# Patient Record
Sex: Female | Born: 1960 | Race: White | Hispanic: No | Marital: Married | Smoking: Never smoker
Health system: Southern US, Community
[De-identification: ages and names within clinical notes are randomized; demographics above are authoritative.]

## PROBLEM LIST (undated history)

## (undated) DIAGNOSIS — E119 Type 2 diabetes mellitus without complications: Secondary | ICD-10-CM

## (undated) DIAGNOSIS — I10 Essential (primary) hypertension: Secondary | ICD-10-CM

## (undated) HISTORY — PX: BREAST SURGERY: SHX581

## (undated) HISTORY — PX: BREAST EXCISIONAL BIOPSY: SUR124

---

## 2011-10-21 ENCOUNTER — Emergency Department: Payer: Self-pay | Admitting: Emergency Medicine

## 2012-06-25 ENCOUNTER — Ambulatory Visit: Payer: Self-pay | Admitting: Obstetrics and Gynecology

## 2012-08-27 HISTORY — PX: OTHER SURGICAL HISTORY: SHX169

## 2013-07-28 ENCOUNTER — Ambulatory Visit: Payer: Self-pay | Admitting: Obstetrics and Gynecology

## 2013-07-28 LAB — CBC
HCT: 37.3 % (ref 35.0–47.0)
HGB: 12.6 g/dL (ref 12.0–16.0)
MCH: 30 pg (ref 26.0–34.0)
MCHC: 33.8 g/dL (ref 32.0–36.0)
MCV: 89 fL (ref 80–100)
Platelet: 297 10*3/uL (ref 150–440)
RBC: 4.2 10*6/uL (ref 3.80–5.20)

## 2013-07-28 LAB — BASIC METABOLIC PANEL
Anion Gap: 3 — ABNORMAL LOW (ref 7–16)
Co2: 30 mmol/L (ref 21–32)
EGFR (African American): >60
EGFR (Non-African Amer.): >60
Osmolality: 278 (ref 275–301)
Sodium: 138 mmol/L (ref 136–145)

## 2013-08-06 ENCOUNTER — Ambulatory Visit: Payer: Self-pay | Admitting: Obstetrics and Gynecology

## 2013-08-07 LAB — BASIC METABOLIC PANEL
Anion Gap: 4 — ABNORMAL LOW (ref 7–16)
BUN: 11 mg/dL (ref 7–18)
Calcium, Total: 8.9 mg/dL (ref 8.5–10.1)
Creatinine: 0.54 mg/dL — ABNORMAL LOW (ref 0.60–1.30)
Glucose: 130 mg/dL — ABNORMAL HIGH (ref 65–99)
Osmolality: 277 (ref 275–301)
Potassium: 3.8 mmol/L (ref 3.5–5.1)

## 2013-08-07 LAB — CBC WITH DIFFERENTIAL/PLATELET
Eosinophil %: 1 %
HCT: 33.6 % — ABNORMAL LOW (ref 35.0–47.0)
HGB: 11.6 g/dL — ABNORMAL LOW (ref 12.0–16.0)
Lymphocyte #: 1.7 10*3/uL (ref 1.0–3.6)
MCH: 30.3 pg (ref 26.0–34.0)
Monocyte %: 6.5 %
Neutrophil %: 64.8 %
Platelet: 244 10*3/uL (ref 150–440)
RBC: 3.85 10*6/uL (ref 3.80–5.20)
RDW: 12.4 % (ref 11.5–14.5)

## 2013-08-10 LAB — PATHOLOGY REPORT

## 2013-08-27 HISTORY — PX: CHOLECYSTECTOMY: SHX55

## 2013-09-09 ENCOUNTER — Ambulatory Visit: Payer: Self-pay | Admitting: Surgery

## 2013-09-17 ENCOUNTER — Ambulatory Visit: Payer: Self-pay | Admitting: Surgery

## 2013-09-20 LAB — PATHOLOGY REPORT

## 2014-07-26 ENCOUNTER — Ambulatory Visit: Payer: Self-pay | Admitting: Obstetrics and Gynecology

## 2014-08-27 ENCOUNTER — Ambulatory Visit: Payer: Self-pay | Admitting: Obstetrics and Gynecology

## 2014-12-05 IMAGING — CR DG CHOLANGIOGRAM OPERATIVE
1 series · 2 of 2 positions shown · non-contrast
Comparison: CT abdomen pelvis -06/25/2012

FLUOROSCOPY TIME:  9 seconds

CLINICAL DATA: Cholecystectomy

EXAM:
INTRAOPERATIVE CHOLANGIOGRAM

[Series 6001: (id) · 2 of 2 slices shown]
[im 1/2]
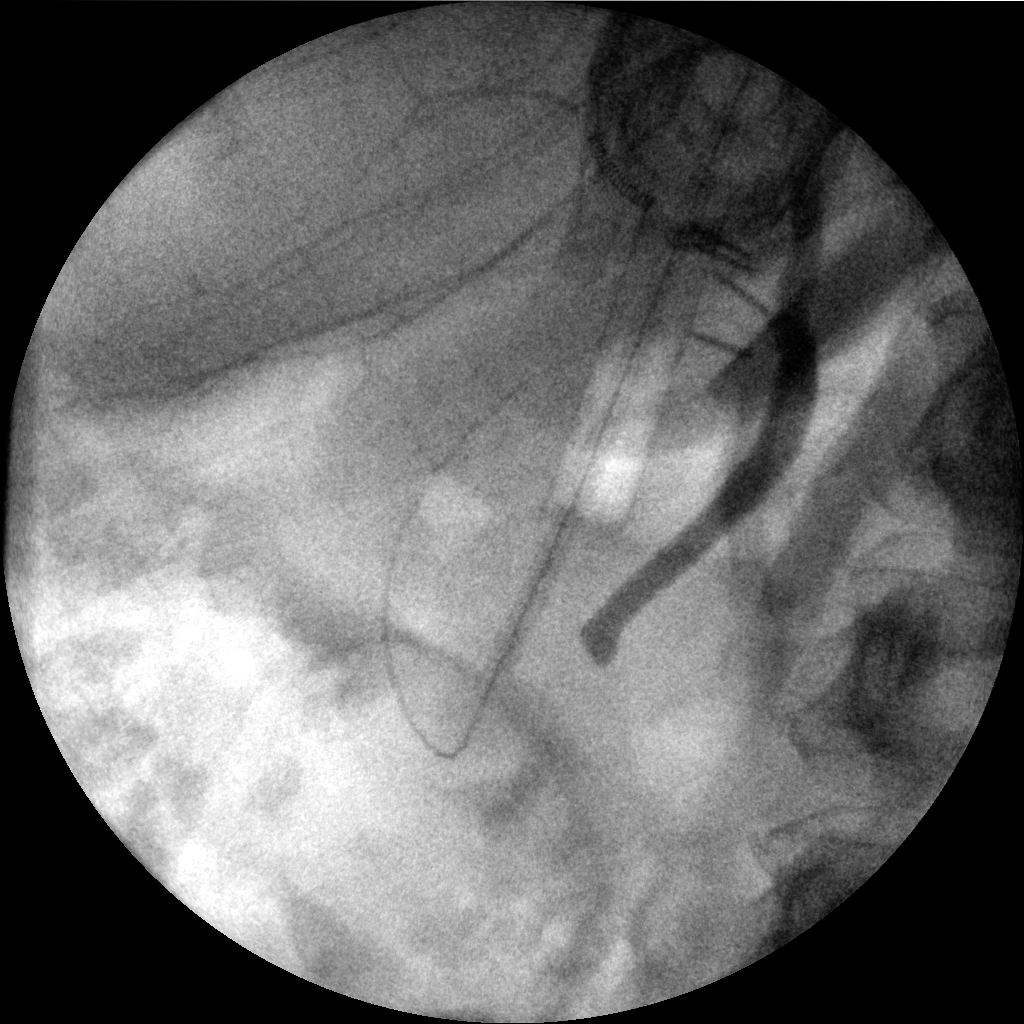
[im 2/2]
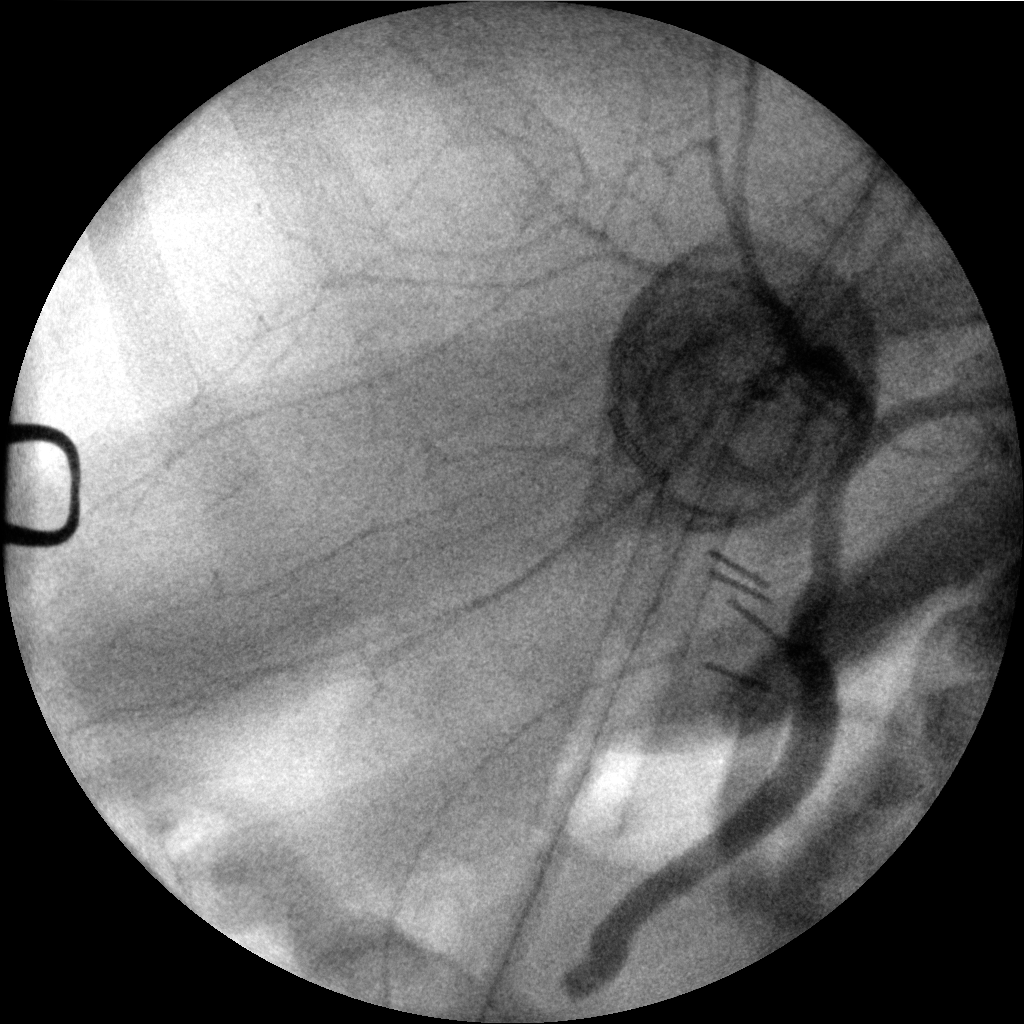

[2 of 2 positions shown; findings below may reference images not displayed]

FINDINGS: Intraoperative angiographic images of the right upper abdominal
quadrant during laparoscopic cholecystectomy are provided for
review.

Surgical clips overlie the expected location of the gallbladder
fossa.

Contrast injection demonstrates selective cannulation of the central
aspect of the cystic duct.

There is passage of contrast through the central aspect of the
cystic duct with filling of a non dilated common bile duct, however
there an apparent abrupt occlusion of the distal aspect of the
common bile duct without definitive passage into the descending
duodenum.

There is minimal reflux of injected contrast into the common hepatic
duct and central aspect of the non dilated intrahepatic biliary
system.
IMPRESSION: Apparent obstruction of the distal aspect of the common bile duct -
as such, a distal stone or stricture is not excluded on the basis of
this examination. Further evaluation with ERCP could be performed as
clinically indicated.

This was made a call report.

## 2014-12-17 NOTE — Op Note (Signed)
PATIENT NAMEKEGAN, [name] MR#:  [id number] DATE OF BIRTH:  1961-02-12  DATE OF PROCEDURE:  08/06/2013  PREOPERATIVE DIAGNOSIS: Complex adnexal masses.   POSTOPERATIVE DIAGNOSES: Endometriosis and right hydrosalpinx.   OPERATION PERFORMED: Diagnostic laparoscopy and right salpingectomy as well as peritoneal and uterine serosa biopsies.   ANESTHESIA USED: General.   PRIMARY SURGEON: Stoney Bang. Georgianne Fick, MD  ASSISTANT: Will Bonnet, MD  ESTIMATED BLOOD LOSS: Minimal.   OPERATIVE FLUIDS: 1 liter of crystalloid.   URINE OUTPUT: 10 mL of clear urine.   PREOPERATIVE ANTIBIOTICS: None.   DRAINS OR TUBES: None.   IMPLANTS: None.   COMPLICATIONS: None.   FINDINGS: Stage II to III endometriosis primarily involving the adnexa. Right hydrosalpinx and several adjacent peritoneal inclusion cysts. The left ovary was adhesed to the pelvic sidewall, and the tube was completely obliterated and not able to be visualized. The cul-de-sac, anterior as well as posterior, was free of adhesive disease.   SPECIMENS REMOVED: Right tube, uterine serosal biopsy and posterior cul-de-sac peritoneal inclusion cyst biopsies.   PATIENT CONDITION FOLLOWING THE PROCEDURE: Stable.   PROCEDURE IN DETAIL: Risks, benefits and alternatives of the procedure were discussed with the patient prior to proceeding to the operating room. The patient had these adnexal masses noted incidentally on other imaging study. She had been followed over the past year with CA-125, which remained in the normal range; however, repeat ultrasound appeared to show a somewhat more complex appearance to the masses than had previously been noted. This may have been a difference in the imaging modality used even though it was ultrasound, a 4-D scan was conducted. The impression at that time had been that the complex seen and excrescences noted were probably fallopian tube fimbria; however, given the fact that we were unable to be 867%  certain without proceeding with a laparoscopy, a decision was made to proceed with diagnostic laparoscopy for confirmation.   The patient was taken to the Operating Room, where she was placed under general endotracheal anesthesia. She was positioned in the dorsal lithotomy position utilizing Allen stirrups. The patient was prepped and draped in the usual sterile fashion, and a timeout procedure was performed. Attention was turned to the patient's pelvis. A sterile speculum was placed. The anterior lip of the cervix was grasped with a single-tooth tenaculum, and a Hulka tenaculum was then placed to allow manipulation of the uterus. The single-tooth tenaculum and operative speculum were removed. The patient's bladder was straight-cathed prior to placing the Hulka tenaculum with only 10 mL of urine returned. Attention was then turned to the patient's abdomen. The umbilicus was infiltrated with 0.5% Sensorcaine. A stab incision was made at the base of the umbilicus, and entry into the peritoneum was undertaken using 5 mm XL trocar under direct visualization. Once pneumoperitoneum had been established, a 5 mm right assistant port and a 10 mm left assistant port were placed under direct visualization. The findings were as noted above. The right tube was bluntly dissected off the pelvic sidewall, and the tube was then transected off its attachments to the mesosalpinx using a 5 mm LigaSure. The uterine serosa had several implants which appeared to be consistent with endometriosis, one of which was sampled. There were subtle peritoneal inclusion cysts, and the right posterior portion of the uterus and adnexa were removed and sent to pathology for analysis. The pedicles were inspected and noted to be hemostatic following the procedure. The pneumoperitoneum was evacuated. Sponge, needle and instrument counts were correct x2. The  trocar sites were dressed with Dermabond, and the 10 mm port site was closed with a 4-0 Monocryl  as well as Dermabond. The patient tolerated the procedure well and was taken to the recovery room in stable condition.    ____________________________ Stoney Bang. Georgianne Fick, MD ams:lb D: 08/08/2013 11:02:10 ET T: 08/08/2013 11:58:09 ET JOB#: [id number]  cc: Stoney Bang. Georgianne Fick, MD, <Dictator> Dorthula Nettles MD ELECTRONICALLY SIGNED 08/16/2013 19:04

## 2014-12-18 NOTE — Op Note (Signed)
PATIENT NAME:  [name], [name] MR#:  [id number] DATE OF BIRTH:  1960-12-11  DATE OF PROCEDURE:  09/17/2013  PREOPERATIVE DIAGNOSIS: Chronic cholecystitis, cholelithiasis.   POSTOPERATIVE DIAGNOSIS: Chronic cholecystitis, cholelithiasis with common bile duct obstruction, possible choledocholithiasis.   PROCEDURE: Laparoscopic cholecystectomy, cholangiogram.   SURGEON: Rochel Brome, M.D.   ANESTHESIA: General.   INDICATIONS: This 54 year old female has history of severe right upper quadrant pain. She had an ultrasound which demonstrated cholelithiasis and surgery was recommended for definitive treatment.   DESCRIPTION OF PROCEDURE: The patient was placed on the operating table in the supine position under general endotracheal anesthesia. The abdomen was prepared with ChloraPrep and draped in a sterile manner.   A short incision was made in the inferior aspect of the umbilicus and carried down to the deep fascia which was grasped with laryngeal hook and elevated. A Veress needle was inserted, aspirated, and irrigated with a saline solution. The peritoneal cavity was insufflated with carbon dioxide. The Veress needle was removed. The 10 mm cannula was inserted. The 10 mm, 0 degree laparoscope was inserted to view the peritoneal cavity. The liver appeared smooth. There were a few adhesions between the omentum and the falciform ligament. Brief survey revealed unremarkable intestines. Next, with the patient in the reverse Trendelenburg position another incision was made in the epigastrium slightly to the right of the midline to introduce an 11 mm cannula. Two incisions were made in the lateral aspect of the right upper quadrant to introduce two 5 mm cannulas.   A number of adhesions were taken down between the omentum and the falciform ligament with sharp dissection. Subsequently, the gallbladder was examined and it appeared to have some slight thickening of the gallbladder wall and was retracted towards  the right shoulder. Dissection was carried out along the infundibulum, mobilizing the gallbladder with incision of the visceral peritoneum.  The porta hepatis was demonstrated. The cystic duct appeared to be large in size and was dissected free from surrounding structures. The cystic artery was dissected free from surrounding structures. The lower third of the gallbladder was separated from the liver. A critical view of safety was demonstrated. Next, Endoclips were placed across the cystic artery proximally and distally and divided. This allowed better traction on the cystic duct. Next, an Endo Clip was placed across the cystic duct adjacent to the neck of the gallbladder. An incision was made in the cystic duct to introduce a Reddick catheter. Half-strength Conray-60 dye was injected as the cholangiogram was done with fluoroscopy viewing the biliary tree and a normal caliber of the common bile duct, however, no dye passed into the duodenum. There appeared to be abrupt end of the common bile duct and I allowed several minutes and then made additional spot films. I did not see any dye in the duodenum. It appeared that it may possibly be a stone in the distal common duct.   Next, the Reddick catheter was removed. The cystic duct was doubly ligated with Endoclips and divided. Next, the cystic duct was further ligated with a 0 chromic Endoloop and one additional clip was placed across the cystic duct adjacent to the Endoloop. Next, the gallbladder was dissected free from the liver with use of hook and cautery. The site was irrigated with heparinized saline solution. Bleeding was minimal. Hemostasis was subsequently intact. There was no bile drainage during the course of the dissection. The gallbladder was brought up through the infraumbilical incision, opened and suctioned, was found to have multiple large stones, and  in the course of removing small stones from the gallbladder with the stone forceps and stone scoop it  was necessary to lengthen the incision by about 1.5 cm in length and lengthen the fascial incision and subsequently removed the gallbladder with a large stone within. The gallbladder was sent with stones for routine pathology.   Next, 0 Vicryl suture was placed in the fascia to began closure of the fascia at the umbilicus and then the 10 mm cannula was reinserted. There were several small stones laying on the omentum which were retrieved with the stone scoop and also suctioned and irrigated this area. The right upper quadrant was further inspected, irrigated and suctioned and hemostasis was intact.   Next, the cannulas were removed. There were several subcutaneous bleeding points which were cauterized. All wounds were infiltrated with 0.5% Sensorcaine with epinephrine. Hemostasis was subsequently intact. It is noted that the remainder of the fascial defect at the umbilicus was closed with interrupted 0 Vicryl figure-of-eight sutures. Skin incisions were closed with interrupted 5-0 chromic subcuticular sutures, benzoin, and Steri-Strips. Dressings were applied with paper tape. The patient tolerated the surgery satisfactorily and was moved to the recovery room for postoperative care.  ____________________________ J. Rochel Brome, MD jws:sg D: 09/17/2013 10:10:06 ET T: 09/17/2013 10:37:00 ET JOB#: [id number]  cc: Loreli Dollar, MD, <Dictator> Loreli Dollar MD ELECTRONICALLY SIGNED 09/22/2013 9:00

## 2016-10-31 ENCOUNTER — Other Ambulatory Visit: Payer: Self-pay | Admitting: Obstetrics and Gynecology

## 2016-10-31 NOTE — Telephone Encounter (Signed)
Pt aware.

## 2016-10-31 NOTE — Telephone Encounter (Signed)
[facility] annual was in 06/2016. Last time refilled was 10/2015. Please advise if patient needs medication follow up, Or please refill. Thanks   KJ CMA

## 2016-11-26 ENCOUNTER — Telehealth: Payer: Self-pay

## 2016-11-26 NOTE — Telephone Encounter (Signed)
Patient calling to request order for diagnostic mammogram. Pt had annual with [facility] 06/2016. She would like to have scheduled this week as she is off for spring break. cb# [phone] thank you

## 2016-11-28 ENCOUNTER — Other Ambulatory Visit: Payer: Self-pay | Admitting: Obstetrics and Gynecology

## 2016-11-28 DIAGNOSIS — Z1239 Encounter for other screening for malignant neoplasm of breast: Secondary | ICD-10-CM

## 2016-11-28 NOTE — Telephone Encounter (Signed)
I have placed an order for a screening mammogram. In order to the order a diagnostic mammogram there has to be a palpable area of concern and I'd have to see her first

## 2016-11-28 NOTE — Telephone Encounter (Signed)
Pt aware and appreciative  

## 2017-03-07 ENCOUNTER — Encounter: Payer: Self-pay | Admitting: Obstetrics and Gynecology

## 2017-11-16 ENCOUNTER — Other Ambulatory Visit: Payer: Self-pay | Admitting: Obstetrics and Gynecology

## 2018-01-10 ENCOUNTER — Ambulatory Visit (INDEPENDENT_AMBULATORY_CARE_PROVIDER_SITE_OTHER): Payer: BC Managed Care – PPO | Admitting: Obstetrics and Gynecology

## 2018-01-10 ENCOUNTER — Encounter: Payer: Self-pay | Admitting: Obstetrics and Gynecology

## 2018-01-10 DIAGNOSIS — Z1231 Encounter for screening mammogram for malignant neoplasm of breast: Secondary | ICD-10-CM | POA: Diagnosis not present

## 2018-01-10 DIAGNOSIS — Z124 Encounter for screening for malignant neoplasm of cervix: Secondary | ICD-10-CM | POA: Diagnosis not present

## 2018-01-10 DIAGNOSIS — Z0000 Encounter for general adult medical examination without abnormal findings: Secondary | ICD-10-CM | POA: Diagnosis not present

## 2018-01-10 DIAGNOSIS — Z01419 Encounter for gynecological examination (general) (routine) without abnormal findings: Secondary | ICD-10-CM | POA: Diagnosis not present

## 2018-01-10 DIAGNOSIS — Z1239 Encounter for other screening for malignant neoplasm of breast: Secondary | ICD-10-CM

## 2018-01-10 NOTE — Patient Instructions (Signed)
Preventive Care 40-64 Years, Female Preventive care refers to lifestyle choices and visits with your health care provider that can promote health and wellness. What does preventive care include?  A yearly physical exam. This is also called an annual well check.  Dental exams once or twice a year.  Routine eye exams. Ask your health care provider how often you should have your eyes checked.  Personal lifestyle choices, including: ? Daily care of your teeth and gums. ? Regular physical activity. ? Eating a healthy diet. ? Avoiding tobacco and drug use. ? Limiting alcohol use. ? Practicing safe sex. ? Taking low-dose aspirin daily starting at age 58. ? Taking vitamin and mineral supplements as recommended by your health care provider. What happens during an annual well check? The services and screenings done by your health care provider during your annual well check will depend on your age, overall health, lifestyle risk factors, and family history of disease. Counseling Your health care provider may ask you questions about your:  Alcohol use.  Tobacco use.  Drug use.  Emotional well-being.  Home and relationship well-being.  Sexual activity.  Eating habits.  Work and work Statistician.  Method of birth control.  Menstrual cycle.  Pregnancy history.  Screening You may have the following tests or measurements:  Height, weight, and BMI.  Blood pressure.  Lipid and cholesterol levels. These may be checked every 5 years, or more frequently if you are over 81 years old.  Skin check.  Lung cancer screening. You may have this screening every year starting at age 78 if you have a 30-pack-year history of smoking and currently smoke or have quit within the past 15 years.  Fecal occult blood test (FOBT) of the stool. You may have this test every year starting at age 65.  Flexible sigmoidoscopy or colonoscopy. You may have a sigmoidoscopy every 5 years or a colonoscopy  every 10 years starting at age 30.  Hepatitis C blood test.  Hepatitis B blood test.  Sexually transmitted disease (STD) testing.  Diabetes screening. This is done by checking your blood sugar (glucose) after you have not eaten for a while (fasting). You may have this done every 1-3 years.  Mammogram. This may be done every 1-2 years. Talk to your health care provider about when you should start having regular mammograms. This may depend on whether you have a family history of breast cancer.  BRCA-related cancer screening. This may be done if you have a family history of breast, ovarian, tubal, or peritoneal cancers.  Pelvic exam and Pap test. This may be done every 3 years starting at age 80. Starting at age 36, this may be done every 5 years if you have a Pap test in combination with an HPV test.  Bone density scan. This is done to screen for osteoporosis. You may have this scan if you are at high risk for osteoporosis.  Discuss your test results, treatment options, and if necessary, the need for more tests with your health care provider. Vaccines Your health care provider may recommend certain vaccines, such as:  Influenza vaccine. This is recommended every year.  Tetanus, diphtheria, and acellular pertussis (Tdap, Td) vaccine. You may need a Td booster every 10 years.  Varicella vaccine. You may need this if you have not been vaccinated.  Zoster vaccine. You may need this after age 5.  Measles, mumps, and rubella (MMR) vaccine. You may need at least one dose of MMR if you were born in  1957 or later. You may also need a second dose.  Pneumococcal 13-valent conjugate (PCV13) vaccine. You may need this if you have certain conditions and were not previously vaccinated.  Pneumococcal polysaccharide (PPSV23) vaccine. You may need one or two doses if you smoke cigarettes or if you have certain conditions.  Meningococcal vaccine. You may need this if you have certain  conditions.  Hepatitis A vaccine. You may need this if you have certain conditions or if you travel or work in places where you may be exposed to hepatitis A.  Hepatitis B vaccine. You may need this if you have certain conditions or if you travel or work in places where you may be exposed to hepatitis B.  Haemophilus influenzae type b (Hib) vaccine. You may need this if you have certain conditions.  Talk to your health care provider about which screenings and vaccines you need and how often you need them. This information is not intended to replace advice given to you by your health care provider. Make sure you discuss any questions you have with your health care provider. Document Released: 09/09/2015 Document Revised: 05/02/2016 Document Reviewed: 06/14/2015 Elsevier Interactive Patient Education  2018 [employer] Inc[employer]  

## 2018-01-10 NOTE — Progress Notes (Signed)
Gynecology Annual Exam  PCP: Patient, No Pcp Per  Chief Complaint:  Chief Complaint  Patient presents with  . Gynecologic Exam    History of Present Illness: Patient is a 57 y.o. G3P3 presents for annual exam. The patient has no complaints today.   LMP: No LMP recorded. Postmenopausal no bleeding concerns  The patient is sexually active.She denies dyspareunia.  The patient does perform self breast exams.  There is no notable family history of breast or ovarian cancer in her family.  The patient wears seatbelts: yes.   The patient has regular exercise: not asked.    The patient denies current symptoms of depression.    Review of Systems: Review of Systems  Constitutional: Negative for chills and fever.  HENT: Negative for congestion.   Respiratory: Negative for cough and shortness of breath.   Cardiovascular: Negative for chest pain and palpitations.  Gastrointestinal: Negative for abdominal pain, constipation, diarrhea, heartburn, nausea and vomiting.  Genitourinary: Negative for dysuria, frequency and urgency.  Skin: Negative for itching and rash.  Neurological: Negative for dizziness and headaches.  Endo/Heme/Allergies: Negative for polydipsia.  Psychiatric/Behavioral: Negative for depression.    Past Medical History:  No past medical history on file.  Past Surgical History:  Past Surgical History:  Procedure Laterality Date  . BREAST BIOPSY     x3  . BREAST SURGERY    . CHOLECYSTECTOMY  2015  . Right salpingectomy  2014    Gynecologic History:  No LMP recorded. Last Pap: Results were: 07/11/2016 NIL and HR HPV negative  Last mammogram: 2018 Results were: BI-RAD I  Obstetric History: G3P3  Family History:  Family History  Problem Relation Age of Onset  . Kidney cancer Maternal Grandfather     Social History:  Social History   Socioeconomic History  . Marital status: Married    Spouse name: Not on file  . Number of children: Not on file  .  Years of education: Not on file  . Highest education level: Not on file  Occupational History  . Not on file  Social Needs  . Financial resource strain: Not on file  . Food insecurity:    Worry: Not on file    Inability: Not on file  . Transportation needs:    Medical: Not on file    Non-medical: Not on file  Tobacco Use  . Smoking status: Never Smoker  . Smokeless tobacco: Never Used  Substance and Sexual Activity  . Alcohol use: Not Currently  . Drug use: Never  . Sexual activity: Yes    Partners: Male    Birth control/protection: Surgical    Comment: Vasectomy  Lifestyle  . Physical activity:    Days per week: 0 days    Minutes per session: 0 min  . Stress: Not on file  Relationships  . Social connections:    Talks on phone: Not on file    Gets together: Not on file    Attends religious service: Not on file    Active member of club or organization: Not on file    Attends meetings of clubs or organizations: Not on file    Relationship status: Not on file  . Intimate partner violence:    Fear of current or ex partner: Not on file    Emotionally abused: Not on file    Physically abused: Not on file    Forced sexual activity: Not on file  Other Topics Concern  . Not on file  Social History Narrative  . Not on file    Allergies:  Allergies  Allergen Reactions  . Codeine Nausea Only  . Penicillins Hives  . Cyclobenzaprine Itching and Rash  . Erythromycin Rash    Medications: Prior to Admission medications   Medication Sig Start Date End Date Taking? Authorizing Provider  aspirin EC 81 MG tablet Take by mouth.   Yes [provider]  atorvastatin (LIPITOR) 10 MG tablet Take by mouth. 08/22/17 08/22/18 Yes [provider]  cetirizine-pseudoephedrine (ZYRTEC-D) 5-120 MG tablet Take by mouth.   Yes [provider]  Cholecalciferol (VITAMIN D-1000 MAX ST) 1000 units tablet Take by mouth.   Yes [provider]  losartan (COZAAR)  50 MG tablet Take by mouth. 10/07/17 10/07/18 Yes [provider]  metFORMIN (GLUCOPHAGE-XR) 500 MG 24 hr tablet TAKE 1 TABLET BY MOUTH DAILY WITH DINNER. 09/02/17  Yes [provider]  Multiple Vitamin (MULTIVITAMIN) capsule Take by mouth.   Yes [provider]  Omega-3 Fatty Acids (FISH OIL) 1000 MG CAPS Take by mouth.   Yes [provider]  Turmeric Curcumin 500 MG CAPS Take by mouth.   Yes [provider]  venlafaxine XR (EFFEXOR-XR) 37.5 MG 24 hr capsule TAKE 1 CAPSULE (37.5 MG) BY ORAL ROUTE ONCE DAILY WITH FOOD 11/18/17  Yes Malachy Mood, MD  vitamin B-12 (CYANOCOBALAMIN) 1000 MCG tablet Take by mouth.   Yes [provider]    Physical Exam Vitals: Blood pressure (!) 144/94, pulse (!) 105, height 5\' 1"  (1.549 m), weight 153 lb (69.4 kg).  General: NAD HEENT: normocephalic, anicteric Thyroid: no enlargement, no palpable nodules Pulmonary: No increased work of breathing, CTAB Cardiovascular: RRR, distal pulses 2+ Breast: Breast symmetrical, no tenderness, no palpable nodules or masses, no skin or nipple retraction present, no nipple discharge.  No axillary or supraclavicular lymphadenopathy. Abdomen: NABS, soft, non-tender, non-distended.  Umbilicus without lesions.  No hepatomegaly, splenomegaly or masses palpable. No evidence of hernia  Genitourinary:  External: Normal external female genitalia.  Normal urethral meatus, normal Bartholin's and Skene's glands.    Vagina: Normal vaginal mucosa, no evidence of prolapse.    Cervix: Grossly normal in appearance, no bleeding  Uterus: Non-enlarged, mobile, normal contour.  No CMT  Adnexa: ovaries non-enlarged, no adnexal masses  Rectal: deferred  Lymphatic: no evidence of inguinal lymphadenopathy Extremities: no edema, erythema, or tenderness Neurologic: Grossly intact Psychiatric: mood appropriate, affect full  Female chaperone present for pelvic and breast  portions of the physical  exam    Assessment: 57 y.o. G3P3 routine annual exam  Plan: Problem List Items Addressed This Visit    None    Visit Diagnoses    Screening for malignant neoplasm of cervix       Relevant Orders   PapIG, HPV, rfx 16/18   Breast screening       Relevant Orders   MM DIGITAL SCREENING BILATERAL   Encounter for gynecological examination without abnormal finding       Relevant Orders   PapIG, HPV, rfx 16/18      1) Mammogram - recommend yearly screening mammogram.  Mammogram Was ordered today   2) STI screening  was notoffered and therefore not obtained  3) ASCCP guidelines and rational discussed.  Patient opts for every 3 years screening interval  4) Contraception - N/A  5) Colonoscopy -- up to date 2016 [facility] next 2021  6) Routine healthcare maintenance including cholesterol, diabetes screening discussed managed by PCP  7) Return in  1 year (on 01/11/2019) for annual.   Malachy Mood, MD, [place] 01/10/2018, 3:58 PM

## 2018-01-11 ENCOUNTER — Encounter: Payer: Self-pay | Admitting: Obstetrics and Gynecology

## 2018-01-15 LAB — PAPIG, HPV, RFX 16/18
HPV, high-risk: NEGATIVE
PAP SMEAR COMMENT: 0

## 2018-03-05 ENCOUNTER — Other Ambulatory Visit: Payer: Self-pay

## 2018-03-05 ENCOUNTER — Emergency Department
Admission: EM | Admit: 2018-03-05 | Discharge: 2018-03-05 | Disposition: A | Discharge status: Home / Self Care | Payer: BC Managed Care – PPO | Attending: Emergency Medicine | Admitting: Emergency Medicine

## 2018-03-05 ENCOUNTER — Emergency Department: Payer: BC Managed Care – PPO

## 2018-03-05 DIAGNOSIS — Z7984 Long term (current) use of oral hypoglycemic drugs: Secondary | ICD-10-CM | POA: Insufficient documentation

## 2018-03-05 DIAGNOSIS — I10 Essential (primary) hypertension: Secondary | ICD-10-CM | POA: Insufficient documentation

## 2018-03-05 DIAGNOSIS — Z79899 Other long term (current) drug therapy: Secondary | ICD-10-CM | POA: Insufficient documentation

## 2018-03-05 DIAGNOSIS — R079 Chest pain, unspecified: Secondary | ICD-10-CM | POA: Insufficient documentation

## 2018-03-05 DIAGNOSIS — Z7982 Long term (current) use of aspirin: Secondary | ICD-10-CM | POA: Diagnosis not present

## 2018-03-05 DIAGNOSIS — E119 Type 2 diabetes mellitus without complications: Secondary | ICD-10-CM | POA: Diagnosis not present

## 2018-03-05 DIAGNOSIS — R55 Syncope and collapse: Secondary | ICD-10-CM | POA: Diagnosis not present

## 2018-03-05 HISTORY — DX: Type 2 diabetes mellitus without complications: E11.9

## 2018-03-05 HISTORY — DX: Essential (primary) hypertension: I10

## 2018-03-05 LAB — BASIC METABOLIC PANEL
ANION GAP: 11 (ref 5–15)
BUN: 20 mg/dL (ref 6–20)
CALCIUM: 10 mg/dL (ref 8.9–10.3)
CO2: 27 mmol/L (ref 22–32)
Chloride: 101 mmol/L (ref 98–111)
Creatinine, Ser: 0.54 mg/dL (ref 0.44–1.00)
GFR calc Af Amer: >60 mL/min (ref >60)
GLUCOSE: 127 mg/dL — AB (ref 70–99)
POTASSIUM: 3.3 mmol/L — AB (ref 3.5–5.1)
SODIUM: 139 mmol/L (ref 135–145)

## 2018-03-05 LAB — URINALYSIS, COMPLETE (UACMP) WITH MICROSCOPIC
BILIRUBIN URINE: NEGATIVE
Glucose, UA: NEGATIVE mg/dL
Hgb urine dipstick: NEGATIVE
Ketones, ur: NEGATIVE mg/dL
LEUKOCYTES UA: NEGATIVE
Nitrite: NEGATIVE
PH: 5 (ref 5.0–8.0)
Protein, ur: NEGATIVE mg/dL
SPECIFIC GRAVITY, URINE: 1.01 (ref 1.005–1.030)

## 2018-03-05 LAB — CBC WITH DIFFERENTIAL/PLATELET
BASOS ABS: 0 10*3/uL (ref 0–0.1)
Basophils Relative: 0 %
EOS ABS: 0 10*3/uL (ref 0–0.7)
EOS PCT: 0 %
HCT: 42.4 % (ref 35.0–47.0)
Hemoglobin: 15.1 g/dL (ref 12.0–16.0)
LYMPHS ABS: 1.5 10*3/uL (ref 1.0–3.6)
LYMPHS PCT: 19 %
MCH: 30.7 pg (ref 26.0–34.0)
MCHC: 35.6 g/dL (ref 32.0–36.0)
MCV: 86.2 fL (ref 80.0–100.0)
MONO ABS: 0.5 10*3/uL (ref 0.2–0.9)
MONOS PCT: 7 %
Neutro Abs: 5.8 10*3/uL (ref 1.4–6.5)
Neutrophils Relative %: 74 %
PLATELETS: 313 10*3/uL (ref 150–440)
RBC: 4.92 MIL/uL (ref 3.80–5.20)
RDW: 12.7 % (ref 11.5–14.5)
WBC: 7.9 10*3/uL (ref 3.6–11.0)

## 2018-03-05 LAB — COMPREHENSIVE METABOLIC PANEL
ALK PHOS: 76 U/L (ref 38–126)
ALT: 50 U/L — AB (ref 0–44)
AST: 42 U/L — AB (ref 15–41)
Albumin: 5 g/dL (ref 3.5–5.0)
Anion gap: 12 (ref 5–15)
BUN: 23 mg/dL — AB (ref 6–20)
CALCIUM: 10.7 mg/dL — AB (ref 8.9–10.3)
CHLORIDE: 98 mmol/L (ref 98–111)
CO2: 27 mmol/L (ref 22–32)
CREATININE: 0.48 mg/dL (ref 0.44–1.00)
GFR calc Af Amer: >60 mL/min (ref >60)
Glucose, Bld: 178 mg/dL — ABNORMAL HIGH (ref 70–99)
Potassium: 3.4 mmol/L — ABNORMAL LOW (ref 3.5–5.1)
SODIUM: 137 mmol/L (ref 135–145)
Total Bilirubin: 1.3 mg/dL — ABNORMAL HIGH (ref 0.3–1.2)
Total Protein: 8.6 g/dL — ABNORMAL HIGH (ref 6.5–8.1)

## 2018-03-05 LAB — TROPONIN I

## 2018-03-05 LAB — FIBRIN DERIVATIVES D-DIMER (ARMC ONLY): FIBRIN DERIVATIVES D-DIMER (ARMC): 147.97 ng{FEU}/mL (ref 0.00–499.00)

## 2018-03-05 MED ORDER — SODIUM CHLORIDE 0.9 % IV BOLUS
1000.0000 mL | Freq: Once | INTRAVENOUS | Status: AC
  Administered 2018-03-05: 1000 mL via INTRAVENOUS

## 2018-03-05 MED ORDER — SODIUM CHLORIDE 0.9 % IV BOLUS
1000.0000 mL | Freq: Once | INTRAVENOUS | Status: AC
Start: 2018-03-05 — End: 2018-03-05
  Administered 2018-03-05: 1000 mL via INTRAVENOUS

## 2018-03-05 MED ORDER — POTASSIUM CHLORIDE CRYS ER 20 MEQ PO TBCR
40.0000 meq | EXTENDED_RELEASE_TABLET | Freq: Once | ORAL | Status: AC
  Administered 2018-03-05: 40 meq via ORAL
  Filled 2018-03-05: qty 2

## 2018-03-05 MED ORDER — IOHEXOL 350 MG/ML SOLN
75.0000 mL | Freq: Once | INTRAVENOUS | Status: AC | PRN
  Administered 2018-03-05: 75 mL via INTRAVENOUS

## 2018-03-05 NOTE — ED Provider Notes (Signed)
[facility] Emergency Department Provider Note   ____________________________________________   First MD Initiated Contact with Patient 03/05/18 1105     (approximate)  I have reviewed the triage vital signs and the nursing notes.   HISTORY  Chief Complaint Weakness    HPI [name] is a 57 y.o. female patient was at work and is supposed him today and became pale diaphoretic felt weak and nearly passed out.  She has had problems from last week with high blood pressure generalized weakness she had a headache several days ago that was mild to moderate in nature but it slowly resolved she is been looking pale she had her blood pressure medications adjusted potassium was elevated and is come down a little bit blood pressure is coming down slowly he is also had some nausea today along with a near syncope.  She reports the conference call was warm.   Past Medical History:  Diagnosis Date  . Diabetes mellitus without complication ([place])   . Hypertension     There are no active problems to display for this patient.   Past Surgical History:  Procedure Laterality Date  . BREAST BIOPSY     x3  . BREAST SURGERY    . CHOLECYSTECTOMY  2015  . Right salpingectomy  2014    Prior to Admission medications   Medication Sig Start Date End Date Taking? Authorizing Provider  aspirin EC 81 MG tablet Take by mouth.   Yes [provider]  atorvastatin (LIPITOR) 10 MG tablet Take 10 mg by mouth daily.  08/22/17 08/22/18 Yes [provider]  Cholecalciferol (VITAMIN D-1000 MAX ST) 1000 units tablet Take by mouth.   Yes [provider]  CINNAMON PO Take 1 tablet by mouth daily.   Yes [provider]  losartan-hydrochlorothiazide (HYZAAR) 50-12.5 MG tablet Take 1 tablet by mouth daily.  02/20/18  Yes [provider]  metFORMIN (GLUCOPHAGE-XR) 500 MG 24 hr tablet TAKE 1 TABLET BY MOUTH DAILY WITH DINNER. 09/02/17  Yes [provider]  Multiple Vitamin (MULTIVITAMIN) capsule Take 1 capsule by mouth daily.    Yes [provider]  Turmeric Curcumin 500 MG CAPS Take 1 capsule by mouth daily.    Yes [provider]  vitamin B-12 (CYANOCOBALAMIN) 1000 MCG tablet Take 1,000 mcg by mouth daily.    Yes [provider]  venlafaxine XR (EFFEXOR-XR) 37.5 MG 24 hr capsule TAKE 1 CAPSULE (37.5 MG) BY ORAL ROUTE ONCE DAILY WITH FOOD 11/18/17   Malachy Mood, MD    Allergies Codeine; Mandol [cefamandole]; Penicillins; Cyclobenzaprine; and Erythromycin  Family History  Problem Relation Age of Onset  . Kidney cancer Maternal Grandfather     Social History Social History   Tobacco Use  . Smoking status: Never Smoker  . Smokeless tobacco: Never Used  Substance Use Topics  . Alcohol use: Not Currently  . Drug use: Never    Review of Systems  Constitutional: No fever/chills Eyes: No visual changes. ENT: No sore throat. Cardiovascular: Denies chest pain. Respiratory: Denies shortness of breath. Gastrointestinal: No abdominal pain.   nausea, no vomiting.  No diarrhea.  No constipation. Genitourinary: Negative for dysuria. Musculoskeletal: Negative for back pain. Skin: Negative for rash. Neurological: Negative for headaches, focal weakness   ____________________________________________   PHYSICAL EXAM:  VITAL SIGNS: ED Triage Vitals [03/05/18 1034]  Enc Vitals Group     BP (!) 151/78     Pulse Rate 99     Resp 16  Temp 99.5 F (37.5 C)     Temp Source Oral     SpO2 99 %     Weight 143 lb (64.9 kg)     Height 5\' 1"  (1.549 m)     Head Circumference      Peak Flow      Pain Score 0     Pain Loc      Pain Edu?      Excl. in [place]?     Constitutional: Alert and oriented. Well appearing and in no acute distress. Eyes: Conjunctivae are normal. PER. EOMI. Head: Atraumatic. Nose: No congestion/rhinnorhea. Mouth/Throat: Mucous membranes are moist.  Oropharynx  non-erythematous. Neck: No stridor. Cardiovascular: Normal rate, regular rhythm. Grossly normal heart sounds.  Good peripheral circulation. Respiratory: Normal respiratory effort.  No retractions. Lungs CTAB. Gastrointestinal: Soft and nontender. No distention. No abdominal bruits. No CVA tenderness. Musculoskeletal: No lower extremity tenderness nor edema.  No joint effusions. Neurologic:  Normal speech and language. No gross focal neurologic deficits are appreciated.  Pacifically cranial nerves II through XII are intact of the visual fields were not checked cerebellar finger-nose rapid alternating movements and hands were normal motor was 5 out of 5 throughout no gait instability. Skin:  Skin is warm, dry and intact. No rash noted. Psychiatric: Mood and affect are normal. Speech and behavior are normal.  ____________________________________________   LABS (all labs ordered are listed, but only abnormal results are displayed)  Labs Reviewed  COMPREHENSIVE METABOLIC PANEL - Abnormal; Notable for the following components:      Result Value   Potassium 3.4 (*)    Glucose, Bld 178 (*)    BUN 23 (*)    Calcium 10.7 (*)    Total Protein 8.6 (*)    AST 42 (*)    ALT 50 (*)    Total Bilirubin 1.3 (*)    All other components within normal limits  URINALYSIS, COMPLETE (UACMP) WITH MICROSCOPIC - Abnormal; Notable for the following components:   Color, Urine YELLOW (*)    APPearance CLEAR (*)    Bacteria, UA RARE (*)    All other components within normal limits  BASIC METABOLIC PANEL - Abnormal; Notable for the following components:   Potassium 3.3 (*)    Glucose, Bld 127 (*)    All other components within normal limits  CBC WITH DIFFERENTIAL/PLATELET  TROPONIN I  TROPONIN I  FIBRIN DERIVATIVES D-DIMER ([facility] ONLY)   ____________________________________________  EKG  EKG read interpreted by me shows sinus tachycardia rate of 101 left axis diffusely flattened T waves in the chest  leads especially there is a complete loss of anterior forces there.  This looks very similar to an EKG done 2 days ago in the office that the patient had with her.  She does have a S1Q3T3 on EKG but no chest pain tightness shortness of breath or any other complaints.  No swelling in the ankles ____________________________________________  RADIOLOGY  ED MD interpretation: KG read by radiology reviewed by me shows no acute disease  Official radiology report(s): Ct Angio Chest Pe W And/or Wo Contrast  Result Date: 03/05/2018 CLINICAL DATA:  Complex chest pain with intermediate to high probability of pulmonary embolism clinically, history hypertension, generalized weakness, episodes of becoming hot, diaphoretic and pale, diabetes mellitus EXAM: CT ANGIOGRAPHY CHEST WITH CONTRAST TECHNIQUE: Multidetector CT imaging of the chest was performed using the standard protocol during bolus administration of intravenous contrast. Multiplanar CT image reconstructions and MIPs were obtained to evaluate the  vascular anatomy. CONTRAST:  81mL OMNIPAQUE IOHEXOL 350 MG/ML SOLN IV COMPARISON:  None FINDINGS: Cardiovascular: Aorta normal caliber without aneurysm or dissection. Heart unremarkable. No pericardial effusion. Pulmonary arteries adequately opacified and patent. No evidence of pulmonary embolism. Mediastinum/Nodes: Base of cervical region normal appearance. Esophagus unremarkable. No thoracic adenopathy. Lungs/Pleura: Lungs clear. No pulmonary infiltrate, pleural effusion or pneumothorax. Upper Abdomen: Slight thickening of RIGHT adrenal gland versus 10 x 6 mm nodule. Remaining visualized upper abdomen unremarkable. Musculoskeletal: No acute osseous findings. Review of the MIP images confirms the above findings. IMPRESSION: No evidence of pulmonary embolism. No acute intrathoracic abnormalities. Questionable 10 x 6 mm RIGHT adrenal nodule versus nodular thickening of the adrenal gland. Electronically Signed   By: Lavonia Dana M.D.   On: 03/05/2018 16:06   Dg Chest Portable 1 View  Result Date: 03/05/2018 CLINICAL DATA:  Generalized weakness.  Hypertension. EXAM: PORTABLE CHEST 1 VIEW COMPARISON:  None. FINDINGS: Lungs are clear. Heart size and pulmonary vascularity are normal. No adenopathy. No bone lesions. IMPRESSION: No edema or consolidation. Electronically Signed   By: Lowella Grip III M.D.   On: 03/05/2018 11:47    ____________________________________________   PROCEDURES  Procedure(s) performed:   Procedures  Critical Care performed:   ____________________________________________   INITIAL IMPRESSION / ASSESSMENT AND PLAN / ED COURSE  ----------------------------------------- 3:38 PM on 03/05/2018 -----------------------------------------  Patient is lying in bed sleeping and her heart rate is going up higher is got gone up to 105.  We will go ahead and CT the patient's chest just to make sure there is no PE since she had near syncope and is having this S1Q3T3 complex.  We will also watch her and see what heart rate she develops.  She has had 2-1/2 L of fluid already do not believe she is dehydrated at this point.  All patient's tests are essentially negative.  Patient feels better.  I will let her go home her son and her husband will watch her overnight and they will follow with her doctor and return here if there is any problems.       ____________________________________________   FINAL CLINICAL IMPRESSION(S) / ED DIAGNOSES  Final diagnoses:  Near syncope     ED Discharge Orders    None       Note:  This document was prepared using Dragon voice recognition software and may include unintentional dictation errors.    Nena Polio, MD 03/05/18 [phone]

## 2018-03-05 NOTE — ED Triage Notes (Signed)
Pt to ER c/o generalized weakness, hypertension, episodes of becoming hot, diaphoretic and pale. Pt alert and oriented X4, active, cooperative, pt in NAD. RR even and unlabored, color WNL.

## 2018-03-05 NOTE — Discharge Instructions (Addendum)
I think what happened today is that you got a little overheated and dehydrated.  Your lab work looks good now.  I want you to follow-up with Dr. Ubaldo Glassing tomorrow at 2 PM.  He made an office appointment for you.  Please get your potassium rechecked in a week.

## 2018-04-14 ENCOUNTER — Ambulatory Visit
Admission: RE | Admit: 2018-04-14 | Discharge: 2018-04-14 | Disposition: A | Discharge status: Home / Self Care | Payer: BC Managed Care – PPO | Source: Ambulatory Visit | Attending: Obstetrics and Gynecology | Admitting: Obstetrics and Gynecology

## 2018-04-14 DIAGNOSIS — Z1231 Encounter for screening mammogram for malignant neoplasm of breast: Secondary | ICD-10-CM | POA: Diagnosis not present

## 2018-04-14 DIAGNOSIS — Z1239 Encounter for other screening for malignant neoplasm of breast: Secondary | ICD-10-CM

## 2018-05-05 ENCOUNTER — Other Ambulatory Visit: Payer: Self-pay | Admitting: Obstetrics and Gynecology

## 2018-05-05 DIAGNOSIS — R921 Mammographic calcification found on diagnostic imaging of breast: Secondary | ICD-10-CM

## 2018-05-05 DIAGNOSIS — R928 Other abnormal and inconclusive findings on diagnostic imaging of breast: Secondary | ICD-10-CM

## 2018-05-12 ENCOUNTER — Ambulatory Visit
Admission: RE | Admit: 2018-05-12 | Discharge: 2018-05-12 | Disposition: A | Discharge status: Home / Self Care | Payer: BC Managed Care – PPO | Source: Ambulatory Visit | Attending: Obstetrics and Gynecology | Admitting: Obstetrics and Gynecology

## 2018-05-12 DIAGNOSIS — R921 Mammographic calcification found on diagnostic imaging of breast: Secondary | ICD-10-CM

## 2018-05-12 DIAGNOSIS — R928 Other abnormal and inconclusive findings on diagnostic imaging of breast: Secondary | ICD-10-CM

## 2018-10-29 ENCOUNTER — Telehealth: Payer: Self-pay

## 2018-10-29 NOTE — Telephone Encounter (Signed)
Last appointment was 12/2017, please advise

## 2018-10-29 NOTE — Telephone Encounter (Signed)
Pt calling - it's time for 30m f/u mammogram for calcifications - needs order put in so she can schedule.  Also, pharmacy said refill of her medication, venlafaxine, was denied.  [phone]

## 2018-10-30 ENCOUNTER — Other Ambulatory Visit: Payer: Self-pay | Admitting: Obstetrics and Gynecology

## 2018-10-30 DIAGNOSIS — R928 Other abnormal and inconclusive findings on diagnostic imaging of breast: Secondary | ICD-10-CM

## 2018-10-30 MED ORDER — VENLAFAXINE HCL ER 37.5 MG PO CP24: ORAL_CAPSULE | ORAL | 2 refills | Status: DC

## 2018-10-30 NOTE — Telephone Encounter (Signed)
Order is in on both

## 2018-11-03 NOTE — Telephone Encounter (Signed)
Pt aware.

## 2018-12-02 ENCOUNTER — Ambulatory Visit
Admission: RE | Admit: 2018-12-02 | Discharge: 2018-12-02 | Disposition: A | Discharge status: Home / Self Care | Payer: BC Managed Care – PPO | Source: Ambulatory Visit | Attending: Obstetrics and Gynecology | Admitting: Obstetrics and Gynecology

## 2018-12-02 ENCOUNTER — Other Ambulatory Visit: Payer: Self-pay

## 2018-12-02 ENCOUNTER — Other Ambulatory Visit: Payer: Self-pay | Admitting: Obstetrics and Gynecology

## 2018-12-02 DIAGNOSIS — R928 Other abnormal and inconclusive findings on diagnostic imaging of breast: Secondary | ICD-10-CM

## 2018-12-02 DIAGNOSIS — Z1231 Encounter for screening mammogram for malignant neoplasm of breast: Secondary | ICD-10-CM

## 2018-12-02 DIAGNOSIS — R921 Mammographic calcification found on diagnostic imaging of breast: Secondary | ICD-10-CM

## 2019-06-19 ENCOUNTER — Ambulatory Visit
Admission: RE | Admit: 2019-06-19 | Discharge: 2019-06-19 | Disposition: A | Discharge status: Home / Self Care | Payer: BC Managed Care – PPO | Source: Ambulatory Visit | Attending: Obstetrics and Gynecology | Admitting: Obstetrics and Gynecology

## 2019-06-19 DIAGNOSIS — R921 Mammographic calcification found on diagnostic imaging of breast: Secondary | ICD-10-CM | POA: Diagnosis present

## 2019-06-19 DIAGNOSIS — Z1231 Encounter for screening mammogram for malignant neoplasm of breast: Secondary | ICD-10-CM | POA: Diagnosis present

## 2019-10-24 ENCOUNTER — Ambulatory Visit: Payer: BC Managed Care – PPO | Attending: Internal Medicine

## 2019-10-24 DIAGNOSIS — Z23 Encounter for immunization: Secondary | ICD-10-CM | POA: Insufficient documentation

## 2019-10-24 NOTE — Progress Notes (Signed)
   Covid-19 Vaccination Clinic  Name:  Gayane Tugman    MRN: [id number] DOB: 01/30/1961  10/24/2019  Ms. Altier was observed post Covid-19 immunization for 15 minutes without incidence. She was provided with Vaccine Information Sheet and instruction to access the V-Safe system.   Ms. Vilches was instructed to call 911 with any severe reactions post vaccine: Marland Kitchen Difficulty breathing  . Swelling of your face and throat  . A fast heartbeat  . A bad rash all over your body  . Dizziness and weakness    Immunizations Administered    Name Date Dose VIS Date Route   Moderna COVID-19 Vaccine 10/24/2019 11:08 AM 0.5 mL 07/28/2019 Intramuscular   Manufacturer: Moderna   Lot: [id number]   [id number]

## 2019-11-12 ENCOUNTER — Telehealth: Payer: Self-pay

## 2019-11-12 NOTE — Telephone Encounter (Signed)
Pt called needing her 6 month mammo order for her right breast, please put orders in for [facility]

## 2019-11-21 ENCOUNTER — Ambulatory Visit: Payer: BC Managed Care – PPO | Attending: Internal Medicine

## 2019-11-21 DIAGNOSIS — Z23 Encounter for immunization: Secondary | ICD-10-CM

## 2019-11-21 NOTE — Progress Notes (Signed)
   Covid-19 Vaccination Clinic  Name:  [name]    MRN: [id number] DOB: 13-Nov-1960  11/21/2019  [name] was observed post Covid-19 immunization for 15 minutes without incident. She was provided with Vaccine Information Sheet and instruction to access the V-Safe system.   [name] was instructed to call 911 with any severe reactions post vaccine: Marland Kitchen Difficulty breathing  . Swelling of face and throat  . A fast heartbeat  . A bad rash all over body  . Dizziness and weakness   Immunizations Administered    Name Date Dose VIS Date Route   Moderna COVID-19 Vaccine 11/21/2019 11:00 AM 0.5 mL 07/28/2019 Intramuscular   Manufacturer: Levan Hurst   LotUD:[id number]   [place]:[id number]

## 2019-11-23 ENCOUNTER — Other Ambulatory Visit: Payer: Self-pay | Admitting: Obstetrics and Gynecology

## 2019-11-23 DIAGNOSIS — R928 Other abnormal and inconclusive findings on diagnostic imaging of breast: Secondary | ICD-10-CM

## 2019-11-23 NOTE — Telephone Encounter (Signed)
Called and left voicemail letting patient know to contact [facility] to schedule mammogram

## 2019-11-23 NOTE — Telephone Encounter (Signed)
Order is in.

## 2020-02-10 ENCOUNTER — Ambulatory Visit
Admission: RE | Admit: 2020-02-10 | Discharge: 2020-02-10 | Disposition: A | Discharge status: Home / Self Care | Payer: BC Managed Care – PPO | Source: Ambulatory Visit | Attending: Obstetrics and Gynecology | Admitting: Obstetrics and Gynecology

## 2020-02-10 DIAGNOSIS — R928 Other abnormal and inconclusive findings on diagnostic imaging of breast: Secondary | ICD-10-CM | POA: Insufficient documentation

## 2020-11-16 ENCOUNTER — Encounter: Payer: BC Managed Care – PPO | Admitting: Dermatology

## 2021-01-11 ENCOUNTER — Ambulatory Visit (INDEPENDENT_AMBULATORY_CARE_PROVIDER_SITE_OTHER): Payer: BC Managed Care – PPO | Admitting: Obstetrics and Gynecology

## 2021-01-11 ENCOUNTER — Encounter: Payer: Self-pay | Admitting: Obstetrics and Gynecology

## 2021-01-11 ENCOUNTER — Other Ambulatory Visit (HOSPITAL_COMMUNITY)
Admission: RE | Admit: 2021-01-11 | Discharge: 2021-01-11 | Disposition: A | Discharge status: Home / Self Care | Payer: BC Managed Care – PPO | Source: Ambulatory Visit | Attending: Obstetrics and Gynecology | Admitting: Obstetrics and Gynecology

## 2021-01-11 ENCOUNTER — Other Ambulatory Visit: Payer: Self-pay

## 2021-01-11 VITALS — BP 152/90 | HR 99 | Ht 61.0 in | Wt 150.0 lb

## 2021-01-11 DIAGNOSIS — R928 Other abnormal and inconclusive findings on diagnostic imaging of breast: Secondary | ICD-10-CM

## 2021-01-11 DIAGNOSIS — Z1239 Encounter for other screening for malignant neoplasm of breast: Secondary | ICD-10-CM

## 2021-01-11 DIAGNOSIS — Z1211 Encounter for screening for malignant neoplasm of colon: Secondary | ICD-10-CM

## 2021-01-11 DIAGNOSIS — Z01419 Encounter for gynecological examination (general) (routine) without abnormal findings: Secondary | ICD-10-CM

## 2021-01-11 DIAGNOSIS — Z124 Encounter for screening for malignant neoplasm of cervix: Secondary | ICD-10-CM

## 2021-01-11 NOTE — Progress Notes (Signed)
Gynecology Annual Exam  PCP: Derinda Late, MD  Chief Complaint:  Chief Complaint  Patient presents with  . Gynecologic Exam    Annual - mom passed 01/07/21, no concerns. RM 5    History of Present Illness:Patient is a 60 y.o. G3P3 presents for annual exam. The patient has no complaints today.   LMP: No LMP recorded. Patient is postmenopausal. No postmenopausal bleeding  The patient is sexually active. She denies dyspareunia.  The patient does perform self breast exams.  There is no notable family history of breast or ovarian cancer in her family.  The patient wears seatbelts: yes.   The patient has regular exercise: not asked.    The patient denies current symptoms of depression.     Review of Systems: ROS  Past Medical History:  There are no problems to display for this patient.   Past Surgical History:  Past Surgical History:  Procedure Laterality Date  . BREAST EXCISIONAL BIOPSY Bilateral 2001?   x3  . BREAST SURGERY    . CHOLECYSTECTOMY  2015  . Right salpingectomy  2014    Gynecologic History:  No LMP recorded. Patient is postmenopausal. Last Pap: Results were: 01/10/2018 NIL and HR HPV negative  Last mammogram: 02/10/2020 Results were: BI-RAD III diagnostic  Obstetric History: G3P3  Family History:  Family History  Problem Relation Age of Onset  . Kidney cancer Maternal Grandfather     Social History:  Social History   Socioeconomic History  . Marital status: Married    Spouse name: Not on file  . Number of children: Not on file  . Years of education: Not on file  . Highest education level: Not on file  Occupational History  . Not on file  Tobacco Use  . Smoking status: Never Smoker  . Smokeless tobacco: Never Used  Vaping Use  . Vaping Use: Never used  Substance and Sexual Activity  . Alcohol use: Not Currently  . Drug use: Never  . Sexual activity: Yes    Partners: Male    Birth control/protection: Surgical    Comment: Vasectomy   Other Topics Concern  . Not on file  Social History Narrative  . Not on file   Social Determinants of Health   Financial Resource Strain: Not on file  Food Insecurity: Not on file  Transportation Needs: Not on file  Physical Activity: Not on file  Stress: Not on file  Social Connections: Not on file  Intimate Partner Violence: Not on file    Allergies:  Allergies  Allergen Reactions  . Codeine Nausea Only  . Mandol [Cefamandole]   . Penicillins Hives    Has patient had a PCN reaction causing immediate rash, facial/tongue/throat swelling, SOB or lightheadedness with hypotension: Yes Has patient had a PCN reaction causing severe rash involving mucus membranes or skin necrosis: No Has patient had a PCN reaction that required hospitalization: No Has patient had a PCN reaction occurring within the last 10 years: No If all of the above answers are "NO", then may proceed with Cephalosporin use.   . Cyclobenzaprine Itching and Rash  . Erythromycin Rash    Medications: Prior to Admission medications   Medication Sig Start Date End Date Taking? Authorizing Provider  aspirin EC 81 MG tablet Take by mouth.   Yes [provider]  atorvastatin (LIPITOR) 10 MG tablet Take 1 tablet by mouth daily. 12/28/19  Yes [provider]  Cholecalciferol 25 MCG (1000 UT) tablet Take by mouth.  Yes [provider]  losartan-hydrochlorothiazide (HYZAAR) 50-12.5 MG tablet Take 1 tablet by mouth daily.  02/20/18  Yes [provider]  metFORMIN (GLUCOPHAGE-XR) 500 MG 24 hr tablet TAKE 1 TABLET BY MOUTH DAILY WITH DINNER. 09/02/17  Yes [provider]  Multiple Vitamin (MULTIVITAMIN) capsule Take 1 capsule by mouth daily.    Yes [provider]  Turmeric Curcumin 500 MG CAPS Take 1 capsule by mouth daily.    Yes [provider]  venlafaxine XR (EFFEXOR-XR) 37.5 MG 24 hr capsule TAKE 1 CAPSULE (37.5 MG) BY ORAL ROUTE ONCE DAILY WITH FOOD 10/30/18   Yes Malachy Mood, MD  vitamin B-12 (CYANOCOBALAMIN) 1000 MCG tablet Take 1,000 mcg by mouth daily.    Yes [provider]  atorvastatin (LIPITOR) 10 MG tablet Take 10 mg by mouth daily.  08/22/17 08/22/18  [provider]  CINNAMON PO Take 1 tablet by mouth daily. Patient not taking: Reported on 01/11/2021    [provider]    Physical Exam Vitals: Blood pressure (!) 152/90, pulse 99, height 5\' 1"  (1.549 m), weight 150 lb (68 kg).  General: NAD HEENT: normocephalic, anicteric Thyroid: no enlargement, no palpable nodules Pulmonary: No increased work of breathing, CTAB Cardiovascular: RRR, distal pulses 2+ Breast: Breast symmetrical, no tenderness, no palpable nodules or masses, no skin or nipple retraction present, no nipple discharge.  No axillary or supraclavicular lymphadenopathy. Abdomen: NABS, soft, non-tender, non-distended.  Umbilicus without lesions.  No hepatomegaly, splenomegaly or masses palpable. No evidence of hernia  Genitourinary:  External: Normal external female genitalia.  Normal urethral meatus, normal Bartholin's and Skene's glands.    Vagina: Normal vaginal mucosa, no evidence of prolapse.    Cervix: Grossly normal in appearance, no bleeding  Uterus: Non-enlarged, mobile, normal contour.  No CMT  Adnexa: ovaries non-enlarged, no adnexal masses  Rectal: deferred  Lymphatic: no evidence of inguinal lymphadenopathy Extremities: no edema, erythema, or tenderness Neurologic: Grossly intact Psychiatric: mood appropriate, affect full  Female chaperone present for pelvic and breast  portions of the physical exam     Assessment: 60 y.o. G3P3 routine annual exam  Plan: Problem List Items Addressed This Visit   None   Visit Diagnoses    BI-RADS category 3 mammogram result    -  Primary   Relevant Orders   MM DIAG BREAST TOMO BILATERAL   Encounter for gynecological examination without abnormal finding       Screening for malignant  neoplasm of cervix       Relevant Orders   Cytology - PAP (Completed)   Breast screening       Colon cancer screening       Relevant Orders   Ambulatory referral to Gastroenterology      1) Mammogram - recommend yearly screening mammogram.  Mammogram Was ordered today  2) STI screening  was notoffered and therefore not obtained  3) ASCCP guidelines and rational discussed.  Patient opts for yearly screening interval  4) Osteoporosis  - per USPTF routine screening DEXA at age 9  Consider FDA-approved medical therapies in postmenopausal women and men aged 33 years and older, based on the following: a) A hip or vertebral (clinical or morphometric) fracture b) T-score ? -2.5 at the femoral neck or spine after appropriate evaluation to exclude secondary causes C) Low bone mass (T-score between -1.0 and -2.5 at the femoral neck or spine) and a 10-year probability of a hip fracture ? 3% or a 10-year probability of a major osteoporosis-related  fracture ? 20% based on the [place]-adapted WHO algorithm   5) Routine healthcare maintenance including cholesterol, diabetes screening discussed managed by PCP  6) Colonoscopyordered  7) Return in about 1 year (around 01/11/2022) for annual.    Malachy Mood, MD Mosetta Pigeon, [place] 01/11/2021, 3:56 PM

## 2021-01-13 LAB — CYTOLOGY - PAP
Adequacy: ABSENT
Comment: NEGATIVE
Diagnosis: NEGATIVE
High risk HPV: NEGATIVE

## 2021-03-06 ENCOUNTER — Telehealth: Payer: Self-pay

## 2021-03-06 NOTE — Telephone Encounter (Signed)
Pt calling; time for colonoscopy; misplaced paper that tell where to call; calling for number so she can schedule appt.  [place]

## 2021-03-09 ENCOUNTER — Telehealth (INDEPENDENT_AMBULATORY_CARE_PROVIDER_SITE_OTHER): Payer: Self-pay | Admitting: Gastroenterology

## 2021-03-09 DIAGNOSIS — Z86010 Personal history of colonic polyps: Secondary | ICD-10-CM

## 2021-03-09 MED ORDER — PEG 3350-KCL-NA BICARB-NACL 420 G PO SOLR: 4000.0000 mL | Freq: Once | ORAL | 0 refills | Status: AC

## 2021-03-09 NOTE — Progress Notes (Signed)
Gastroenterology Pre-Procedure Review  Request Date: 04/05/21 Requesting Physician: Dr. Bonna Gains  PATIENT REVIEW QUESTIONS: The patient responded to the following health history questions as indicated:    1. Are you having any GI issues? no 2. Do you have a personal history of Polyps? yes (2017 unsure of how many or if any were removed) 3. Do you have a family history of Colon Cancer or Polyps? no 4. Diabetes Mellitus? yes (Type II) 5. Joint replacements in the past 12 months?no 6. Major health problems in the past 3 months?no 7. Any artificial heart valves, MVP, or defibrillator?no    MEDICATIONS & ALLERGIES:    Patient reports the following regarding taking any anticoagulation/antiplatelet therapy:   Plavix, Coumadin, Eliquis, Xarelto, Lovenox, Pradaxa, Brilinta, or Effient? no Aspirin? yes (81 mg)  Patient confirms/reports the following medications:  Current Outpatient Medications  Medication Sig Dispense Refill   aspirin EC 81 MG tablet Take by mouth.     atorvastatin (LIPITOR) 10 MG tablet Take 10 mg by mouth daily.      atorvastatin (LIPITOR) 10 MG tablet Take 1 tablet by mouth daily.     Cholecalciferol 25 MCG (1000 UT) tablet Take by mouth.     CINNAMON PO Take 1 tablet by mouth daily. (Patient not taking: Reported on 01/11/2021)     losartan-hydrochlorothiazide (HYZAAR) 50-12.5 MG tablet Take 1 tablet by mouth daily.   1   metFORMIN (GLUCOPHAGE-XR) 500 MG 24 hr tablet TAKE 1 TABLET BY MOUTH DAILY WITH DINNER.     Multiple Vitamin (MULTIVITAMIN) capsule Take 1 capsule by mouth daily.      Turmeric Curcumin 500 MG CAPS Take 1 capsule by mouth daily.      venlafaxine XR (EFFEXOR-XR) 37.5 MG 24 hr capsule TAKE 1 CAPSULE (37.5 MG) BY ORAL ROUTE ONCE DAILY WITH FOOD 90 capsule 2   vitamin B-12 (CYANOCOBALAMIN) 1000 MCG tablet Take 1,000 mcg by mouth daily.      No current facility-administered medications for this visit.    Patient confirms/reports the following allergies:   Allergies  Allergen Reactions   Codeine Nausea Only   Mandol [Cefamandole]    Penicillins Hives    Has patient had a PCN reaction causing immediate rash, facial/tongue/throat swelling, SOB or lightheadedness with hypotension: Yes Has patient had a PCN reaction causing severe rash involving mucus membranes or skin necrosis: No Has patient had a PCN reaction that required hospitalization: No Has patient had a PCN reaction occurring within the last 10 years: No If all of the above answers are "NO", then may proceed with Cephalosporin use.    Cyclobenzaprine Itching and Rash   Erythromycin Rash    No orders of the defined types were placed in this encounter.   AUTHORIZATION INFORMATION Primary Insurance: 1D#: Group #:  Secondary Insurance: 1D#: Group #:  SCHEDULE INFORMATION: Date: 04/05/21 Time: Location: [place]

## 2021-03-15 ENCOUNTER — Other Ambulatory Visit: Payer: Self-pay

## 2021-03-15 ENCOUNTER — Ambulatory Visit
Admission: RE | Admit: 2021-03-15 | Discharge: 2021-03-15 | Disposition: A | Discharge status: Home / Self Care | Payer: BC Managed Care – PPO | Source: Ambulatory Visit | Attending: Obstetrics and Gynecology | Admitting: Obstetrics and Gynecology

## 2021-03-15 DIAGNOSIS — R928 Other abnormal and inconclusive findings on diagnostic imaging of breast: Secondary | ICD-10-CM | POA: Insufficient documentation

## 2021-04-05 ENCOUNTER — Ambulatory Visit: Payer: BC Managed Care – PPO | Admitting: Anesthesiology

## 2021-04-05 ENCOUNTER — Ambulatory Visit
Admission: RE | Admit: 2021-04-05 | Discharge: 2021-04-05 | Disposition: A | Discharge status: Home / Self Care | Payer: BC Managed Care – PPO | Attending: Gastroenterology | Admitting: Gastroenterology

## 2021-04-05 ENCOUNTER — Encounter
Admission: RE | Disposition: A | Discharge status: Home / Self Care | Payer: Self-pay | Source: Home / Self Care | Attending: Gastroenterology

## 2021-04-05 DIAGNOSIS — Z888 Allergy status to other drugs, medicaments and biological substances status: Secondary | ICD-10-CM | POA: Insufficient documentation

## 2021-04-05 DIAGNOSIS — Z881 Allergy status to other antibiotic agents status: Secondary | ICD-10-CM | POA: Diagnosis not present

## 2021-04-05 DIAGNOSIS — Z1211 Encounter for screening for malignant neoplasm of colon: Secondary | ICD-10-CM | POA: Insufficient documentation

## 2021-04-05 DIAGNOSIS — Z7982 Long term (current) use of aspirin: Secondary | ICD-10-CM | POA: Diagnosis not present

## 2021-04-05 DIAGNOSIS — Z885 Allergy status to narcotic agent status: Secondary | ICD-10-CM | POA: Diagnosis not present

## 2021-04-05 DIAGNOSIS — D175 Benign lipomatous neoplasm of intra-abdominal organs: Secondary | ICD-10-CM | POA: Diagnosis not present

## 2021-04-05 DIAGNOSIS — Z880 Allergy status to penicillin: Secondary | ICD-10-CM | POA: Insufficient documentation

## 2021-04-05 DIAGNOSIS — D124 Benign neoplasm of descending colon: Secondary | ICD-10-CM | POA: Insufficient documentation

## 2021-04-05 DIAGNOSIS — K635 Polyp of colon: Secondary | ICD-10-CM

## 2021-04-05 DIAGNOSIS — Z860100 Personal history of colon polyps, unspecified: Secondary | ICD-10-CM

## 2021-04-05 DIAGNOSIS — Z79899 Other long term (current) drug therapy: Secondary | ICD-10-CM | POA: Diagnosis not present

## 2021-04-05 DIAGNOSIS — K648 Other hemorrhoids: Secondary | ICD-10-CM | POA: Diagnosis not present

## 2021-04-05 DIAGNOSIS — Z7984 Long term (current) use of oral hypoglycemic drugs: Secondary | ICD-10-CM | POA: Diagnosis not present

## 2021-04-05 HISTORY — PX: COLONOSCOPY WITH PROPOFOL: SHX5780

## 2021-04-05 LAB — GLUCOSE, CAPILLARY: Glucose-Capillary: 179 mg/dL — ABNORMAL HIGH (ref 70–99)

## 2021-04-05 SURGERY — COLONOSCOPY WITH PROPOFOL
Anesthesia: General

## 2021-04-05 MED ORDER — DEXMEDETOMIDINE (PRECEDEX) IN NS 20 MCG/5ML (4 MCG/ML) IV SYRINGE
PREFILLED_SYRINGE | INTRAVENOUS | Status: DC | PRN
  Administered 2021-04-05: 12 ug via INTRAVENOUS

## 2021-04-05 MED ORDER — PROPOFOL 500 MG/50ML IV EMUL
INTRAVENOUS | Status: DC | PRN
  Administered 2021-04-05: 160 ug/kg/min via INTRAVENOUS

## 2021-04-05 MED ORDER — PROPOFOL 500 MG/50ML IV EMUL
INTRAVENOUS | Status: AC
  Filled 2021-04-05: qty 50

## 2021-04-05 MED ORDER — PROPOFOL 10 MG/ML IV BOLUS
INTRAVENOUS | Status: DC | PRN
  Administered 2021-04-05: 70 mg via INTRAVENOUS

## 2021-04-05 MED ORDER — SODIUM CHLORIDE 0.9 % IV SOLN
INTRAVENOUS | Status: DC
  Administered 2021-04-05: 1000 mL via INTRAVENOUS

## 2021-04-05 NOTE — Anesthesia Preprocedure Evaluation (Signed)
Anesthesia Evaluation  Patient identified by MRN, date of birth, ID band Patient awake    Reviewed: Allergy & Precautions, NPO status , Patient's Chart, lab work & pertinent test results  History of Anesthesia Complications Negative for: history of anesthetic complications  Airway Mallampati: II  TM Distance: >3 FB Neck ROM: Full    Dental no notable dental hx.    Pulmonary neg sleep apnea, neg COPD,    breath sounds clear to auscultation- rhonchi (-) wheezing      Cardiovascular Exercise Tolerance: Good hypertension, Pt. on medications (-) CAD, (-) Past MI, (-) Cardiac Stents and (-) CABG  Rhythm:Regular Rate:Normal - Systolic murmurs and - Diastolic murmurs    Neuro/Psych neg Seizures negative neurological ROS  negative psych ROS   GI/Hepatic negative GI ROS, Neg liver ROS,   Endo/Other  diabetes, Oral Hypoglycemic Agents  Renal/GU negative Renal ROS     Musculoskeletal negative musculoskeletal ROS (+)   Abdominal (+) - obese,   Peds  Hematology negative hematology ROS (+)   Anesthesia Other Findings Past Medical History: No date: Diabetes mellitus without complication (HCC) No date: Hypertension   Reproductive/Obstetrics                             Anesthesia Physical Anesthesia Plan  ASA: 2  Anesthesia Plan: General   Post-op Pain Management:    Induction: Intravenous  PONV Risk Score and Plan: 2 and Propofol infusion  Airway Management Planned: Natural Airway  Additional Equipment:   Intra-op Plan:   Post-operative Plan:   Informed Consent: I have reviewed the patients History and Physical, chart, labs and discussed the procedure including the risks, benefits and alternatives for the proposed anesthesia with the patient or authorized representative who has indicated his/her understanding and acceptance.     Dental advisory given  Plan Discussed with: CRNA and  Anesthesiologist  Anesthesia Plan Comments:         Anesthesia Quick Evaluation

## 2021-04-05 NOTE — H&P (Signed)
Vonda Antigua, MD [address] [place], [place], [place], [address] [address], [place], [place], [place], [address] Phone: [phone]  Fax: [phone]  Primary Care Physician:  Derinda Late, MD   Pre-Procedure History & Physical: HPI:  [name] is a 60 y.o. female is here for a colonoscopy.   Past Medical History:  Diagnosis Date   Diabetes mellitus without complication ([place])    Hypertension     Past Surgical History:  Procedure Laterality Date   BREAST EXCISIONAL BIOPSY Bilateral 2001?   x3   BREAST SURGERY     CHOLECYSTECTOMY  2015   Right salpingectomy  2014    Prior to Admission medications   Medication Sig Start Date End Date Taking? Authorizing Provider  aspirin EC 81 MG tablet Take by mouth.   Yes [provider]  atorvastatin (LIPITOR) 10 MG tablet Take 1 tablet by mouth daily. 12/28/19  Yes [provider]  Cholecalciferol 25 MCG (1000 UT) tablet Take by mouth.   Yes [provider]  CINNAMON PO Take 1 tablet by mouth daily.   Yes [provider]  losartan-hydrochlorothiazide (HYZAAR) 50-12.5 MG tablet Take 1 tablet by mouth daily.  02/20/18  Yes [provider]  metFORMIN (GLUCOPHAGE-XR) 500 MG 24 hr tablet TAKE 1 TABLET BY MOUTH DAILY WITH DINNER. 09/02/17  Yes [provider]  Multiple Vitamin (MULTIVITAMIN) capsule Take 1 capsule by mouth daily.    Yes [provider]  Turmeric Curcumin 500 MG CAPS Take 1 capsule by mouth daily.    Yes [provider]  venlafaxine XR (EFFEXOR-XR) 37.5 MG 24 hr capsule TAKE 1 CAPSULE (37.5 MG) BY ORAL ROUTE ONCE DAILY WITH FOOD 10/30/18  Yes Malachy Mood, MD  vitamin B-12 (CYANOCOBALAMIN) 1000 MCG tablet Take 1,000 mcg by mouth daily.    Yes [provider]  atorvastatin (LIPITOR) 10 MG tablet Take 10 mg by mouth daily.  08/22/17 08/22/18  [provider]    Allergies as of 03/09/2021 - Review Complete 03/09/2021   Allergen Reaction Noted   Codeine Nausea Only 11/01/2011   Mandol [cefamandole]  03/05/2018   Penicillins Hives 03/31/2012   Cyclobenzaprine Itching and Rash 11/01/2011   Erythromycin Rash 11/01/2011    Family History  Problem Relation Age of Onset   Kidney cancer Maternal Grandfather     Social History   Socioeconomic History   Marital status: Married    Spouse name: Not on file   Number of children: Not on file   Years of education: Not on file   Highest education level: Not on file  Occupational History   Not on file  Tobacco Use   Smoking status: Never   Smokeless tobacco: Never  Vaping Use   Vaping Use: Never used  Substance and Sexual Activity   Alcohol use: Not Currently   Drug use: Never   Sexual activity: Yes    Partners: Male    Birth control/protection: Surgical    Comment: Vasectomy  Other Topics Concern   Not on file  Social History Narrative   Not on file   Social Determinants of Health   Financial Resource Strain: Not on file  Food Insecurity: Not on file  Transportation Needs: Not on file  Physical Activity: Not on file  Stress: Not on file  Social Connections: Not on file  Intimate Partner Violence: Not on file    Review of Systems: See HPI, otherwise negative ROS  Physical Exam: Constitutional: General:   Alert,  Well-developed, well-nourished, pleasant and cooperative in NAD BP (!) 152/90   Pulse 86   Temp (!) 97.3 F (36.3 C)   Resp 16   Ht '5\' 1"'$  (1.549 m)   Wt 65.5 kg   SpO2 99%   BMI 27.26 kg/m   Head: Normocephalic, atraumatic.   Eyes:  Sclera clear, no icterus.   Conjunctiva pink.   Mouth:  No deformity or lesions, oropharynx pink & moist.  Neck:  Supple, trachea midline  Respiratory: Normal respiratory effort  Gastrointestinal:  Soft, non-tender and non-distended without masses, hepatosplenomegaly or hernias noted.  No guarding or rebound tenderness.     Cardiac: No clubbing or edema.  No cyanosis. Normal  posterior tibial pedal pulses noted.  Lymphatic:  No significant cervical adenopathy.  Psych:  Alert and cooperative. Normal mood and affect.  Musculoskeletal:   Symmetrical without gross deformities. 5/5 Lower extremity strength bilaterally.  Skin: Warm. Intact without significant lesions or rashes. No jaundice.  Neurologic:  Face symmetrical, tongue midline, Normal sensation to touch;  grossly normal neurologically.  Psych:  Alert and oriented x3, Alert and cooperative. Normal mood and affect.  Impression/Plan: [name] is here for a colonoscopy to be performed for history of colon polyps. Previous reports not available  Risks, benefits, limitations, and alternatives regarding  colonoscopy have been reviewed with the patient.  Questions have been answered.  All parties agreeable.   Virgel Manifold, MD  04/05/2021, 8:35 AM

## 2021-04-05 NOTE — Transfer of Care (Signed)
Immediate Anesthesia Transfer of Care Note  Patient: [name], [name]  Procedure(s) Performed: COLONOSCOPY WITH PROPOFOL  Patient Location: PACU  Anesthesia Type:General  Level of Consciousness: sedated  Airway & Oxygen Therapy: Patient Spontanous Breathing  Post-op Assessment: Report given to RN and Post -op Vital signs reviewed and stable  Post vital signs: Reviewed and stable  Last Vitals:  Vitals Value Taken Time  BP 105/68 04/05/21 0909  Temp 36.6 C 04/05/21 0909  Pulse 76 04/05/21 0910  Resp 15 04/05/21 0910  SpO2 100 % 04/05/21 0910  Vitals shown include unvalidated device data.  Last Pain:  Vitals:   04/05/21 0909  TempSrc: Temporal  PainSc: Asleep         Complications: No notable events documented.

## 2021-04-05 NOTE — Anesthesia Postprocedure Evaluation (Signed)
Anesthesia Post Note  Patient: Designer, industrial/product  Procedure(s) Performed: COLONOSCOPY WITH PROPOFOL  Patient location during evaluation: Endoscopy Anesthesia Type: General Level of consciousness: awake and alert and oriented Pain management: pain level controlled Vital Signs Assessment: post-procedure vital signs reviewed and stable Respiratory status: spontaneous breathing, nonlabored ventilation and respiratory function stable Cardiovascular status: blood pressure returned to baseline and stable Postop Assessment: no signs of nausea or vomiting Anesthetic complications: no   No notable events documented.   Last Vitals:  Vitals:   04/05/21 0909 04/05/21 0929  BP: 105/68 115/79  Pulse: 77   Resp: 15   Temp: 36.6 C   SpO2: 100%     Last Pain:  Vitals:   04/05/21 0929  TempSrc:   PainSc: 0-No pain                 Kervin Bones

## 2021-04-05 NOTE — Op Note (Signed)
[facility] Gastroenterology Patient Name: [name] Procedure Date: 04/05/2021 8:32 AM MRN: [id number] Account #: [id number] Date of Birth: 11-02-1960 Admit Type: Outpatient Age: 60 Room: [facility] ENDO ROOM 3 Gender: Female Note Status: Finalized Procedure:             Colonoscopy Indications:           High risk colon cancer surveillance: Personal history                         of colonic polyps, A review of care everywhere shows                         that pt had a colonoscopy in 2017 with Dr. Vira Agar and                         1 subcentimeter polyp was removed which was                         hyperplastic. Repeat colonoscopy was recommended in 5                         yrs due to her prior history of polyps. Procedure                         reports prior to this are not available but pt states                         she has had one other colonoscopy 5 yrs prior to this                         which had polyps as well. 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 Bonna Gains MD, MD Medicines:             Monitored Anesthesia Care Complications:         No immediate complications. Procedure:             Pre-Anesthesia Assessment:                        - ASA Grade Assessment: II - A patient with mild                         systemic disease.                        - Prior to the procedure, a History and Physical was                         performed, and patient medications, allergies and  sensitivities were reviewed. The patient's tolerance                         of previous anesthesia was reviewed.                        - The risks and benefits of the procedure and the                         sedation options and risks were discussed with the                         patient. All questions  were answered and informed                         consent was obtained.                        - Patient identification and proposed procedure were                         verified prior to the procedure by the physician, the                         nurse, the anesthesiologist, the anesthetist and the                         technician. The procedure was verified in the                         procedure room.                        After obtaining informed consent, the colonoscope was                         passed under direct vision. Throughout the procedure,                         the patient's blood pressure, pulse, and oxygen                         saturations were monitored continuously. The                         Colonoscope was introduced through the anus and                         advanced to the the cecum, identified by appendiceal                         orifice and ileocecal valve. The colonoscopy was                         performed with ease. The patient tolerated the                         procedure well. The quality of the bowel preparation  was good. Findings:      The perianal and digital rectal examinations were normal.      There was a lipoma, in the ascending colon. Biopsies were taken with a       cold forceps for histology. Tunnel biopsies revealed bright yellow       material consistent with likely lipoma.      A 5 mm polyp was found in the descending colon. The polyp was sessile.       The polyp was removed with a cold snare. Resection and retrieval were       complete.      The exam was otherwise without abnormality.      The rectum, sigmoid colon, descending colon, transverse colon, ascending       colon and cecum appeared normal.      Non-bleeding internal hemorrhoids were found during retroflexion.      No additional abnormalities were found on retroflexion. Impression:            - Lipoma in the ascending colon. Biopsied.                         - One 5 mm polyp in the descending colon, removed with                         a cold snare. Resected and retrieved.                        - The examination was otherwise normal.                        - The rectum, sigmoid colon, descending colon,                         transverse colon, ascending colon and cecum are normal.                        - Non-bleeding internal hemorrhoids. Recommendation:        - Await pathology results.                        - High fiber diet.                        - Discharge patient to home (with escort).                        - Advance diet as tolerated.                        - Continue present medications.                        - Repeat colonoscopy date to be determined after                         pending pathology results are reviewed.                        - The findings and recommendations were discussed with  the patient.                        - The findings and recommendations were discussed with                         the patient's family.                        - Return to primary care physician as previously                         scheduled. Procedure Code(s):     --- Professional ---                        [phone], Colonoscopy, flexible; with removal of                         tumor(s), polyp(s), or other lesion(s) by snare                         technique                        45380, 42, Colonoscopy, flexible; with biopsy, single                         or multiple Diagnosis Code(s):     --- Professional ---                        Z86.010, Personal history of colonic polyps                        D17.5, Benign lipomatous neoplasm of intra-abdominal                         organs                        K63.5, Polyp of colon CPT copyright 2019 American Medical Association. All rights reserved. The codes documented in this report are preliminary and upon coder review may  be revised to meet  current compliance requirements.  Vonda Antigua, MD Margretta Sidle B. Bonna Gains MD, MD 04/05/2021 9:20:36 AM This report has been signed electronically. Number of Addenda: 0 Note Initiated On: 04/05/2021 8:32 AM Scope Withdrawal Time: 0 hours 23 minutes 37 seconds  Total Procedure Duration: 0 hours 25 minutes 56 seconds  Estimated Blood Loss:  Estimated blood loss: none.      [facility]

## 2021-04-06 ENCOUNTER — Encounter: Payer: Self-pay | Admitting: Gastroenterology

## 2021-04-06 LAB — SURGICAL PATHOLOGY

## 2021-04-29 IMAGING — MG MM DIGITAL DIAGNOSTIC UNILAT*R* W/ TOMO W/ CAD
6 series · 6 of 14 positions shown · non-contrast
Comparison: Previous exam(s).

CLINICAL DATA: Follow-up of probable benign calcifications in the
right breast originally seen on the patient's screening mammogram
dated 04/14/2018.

EXAM:
DIGITAL DIAGNOSTIC UNILATERAL RIGHT MAMMOGRAM WITH TOMO AND CAD

[R CC]
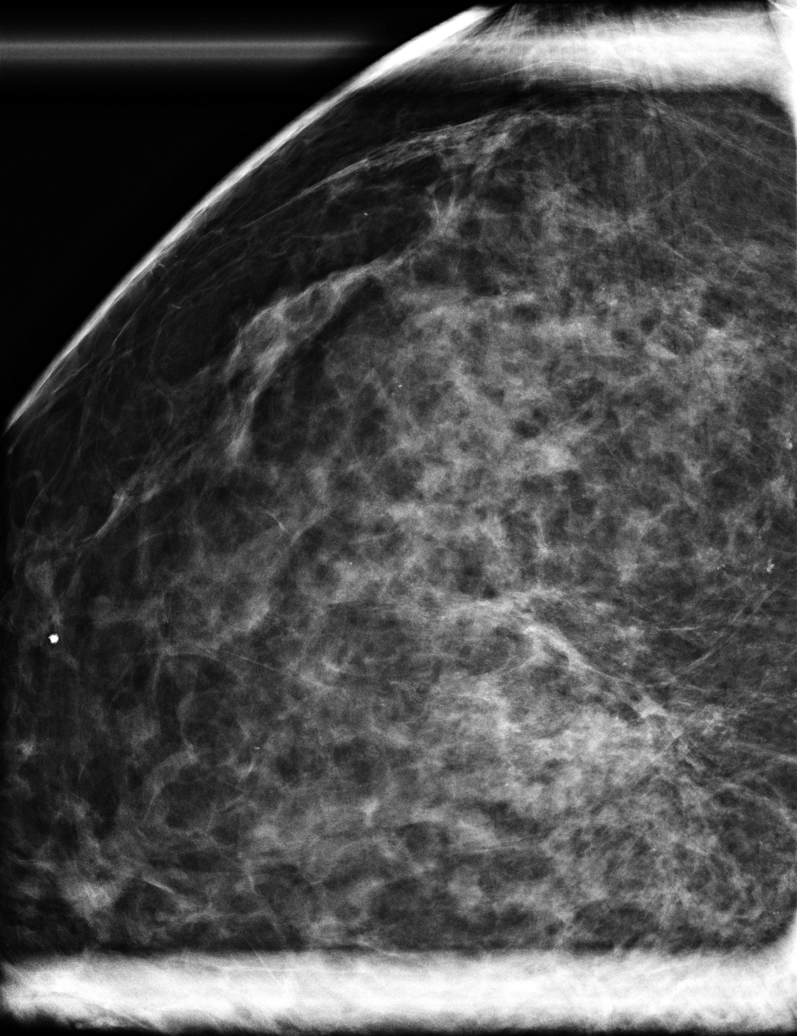

[R ML]
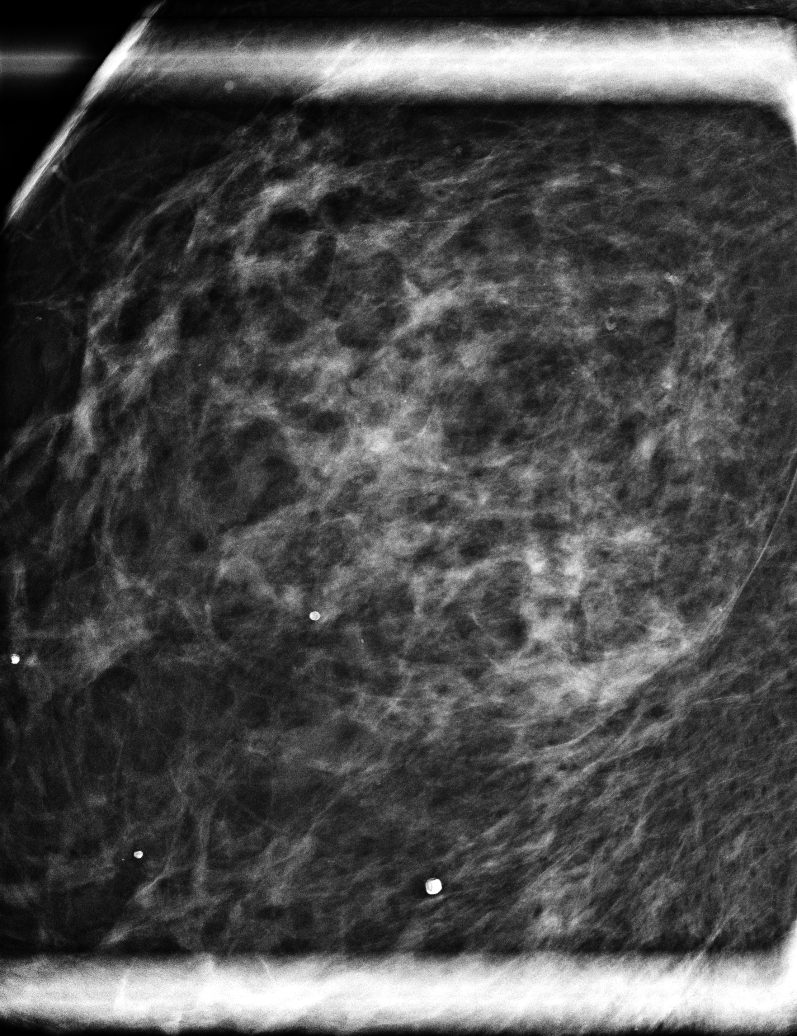

[R CC synth-2D]
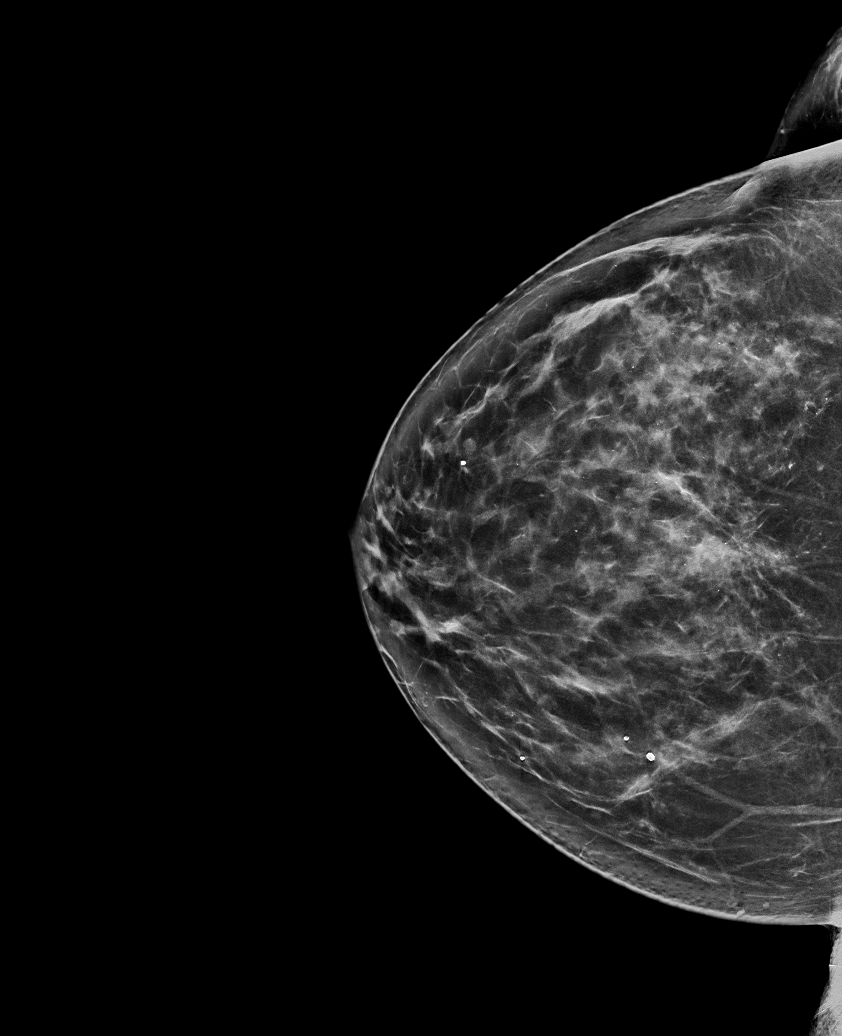

[R MLO synth-2D]
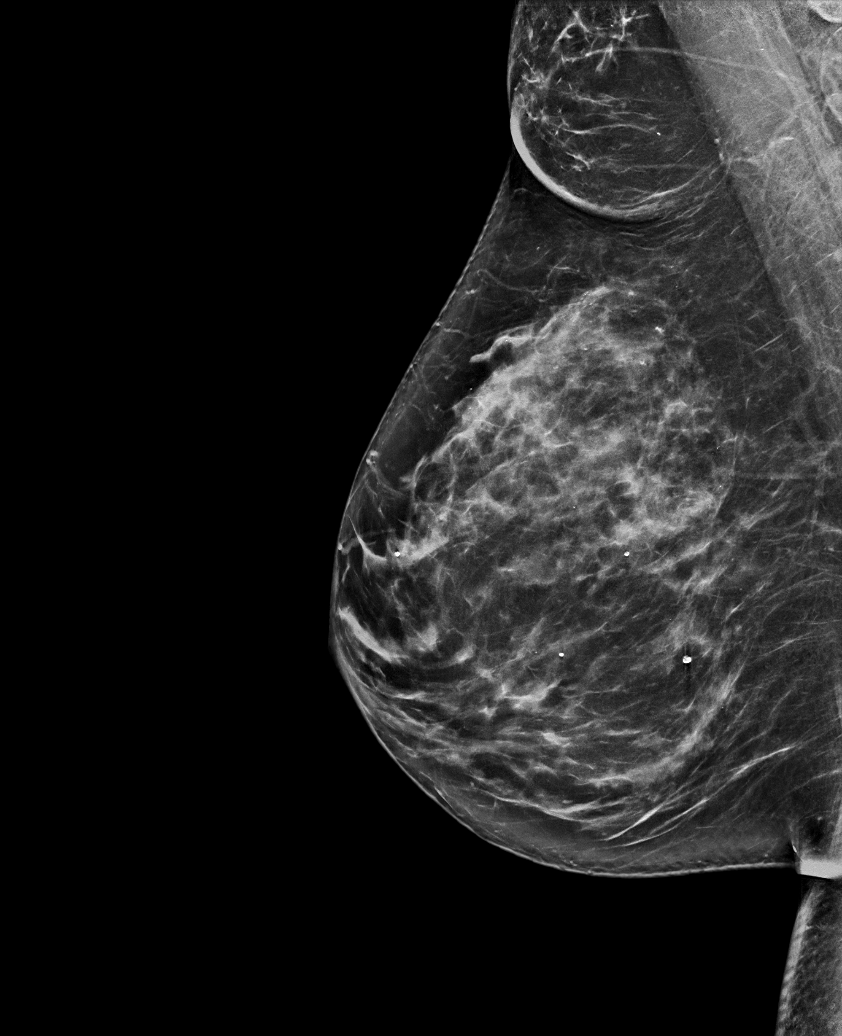

[R MLO tomo · tomo slice 43/84.0]
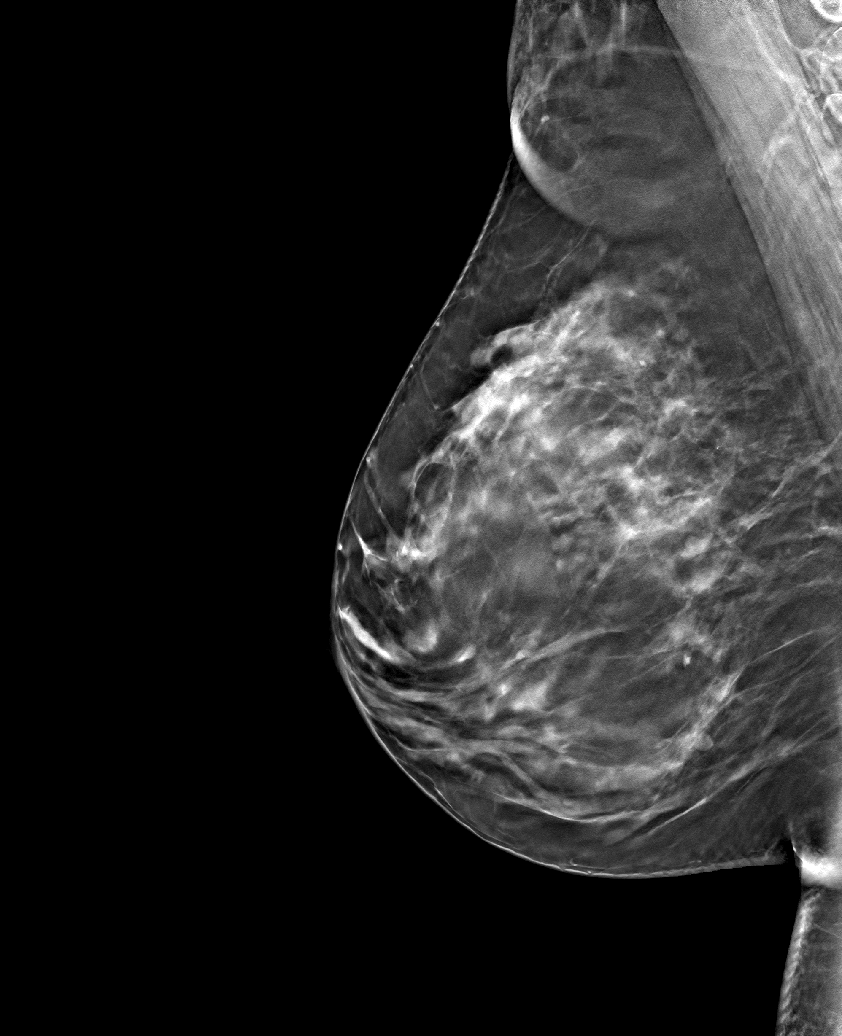

[R CC tomo · tomo slice 42/83.0]
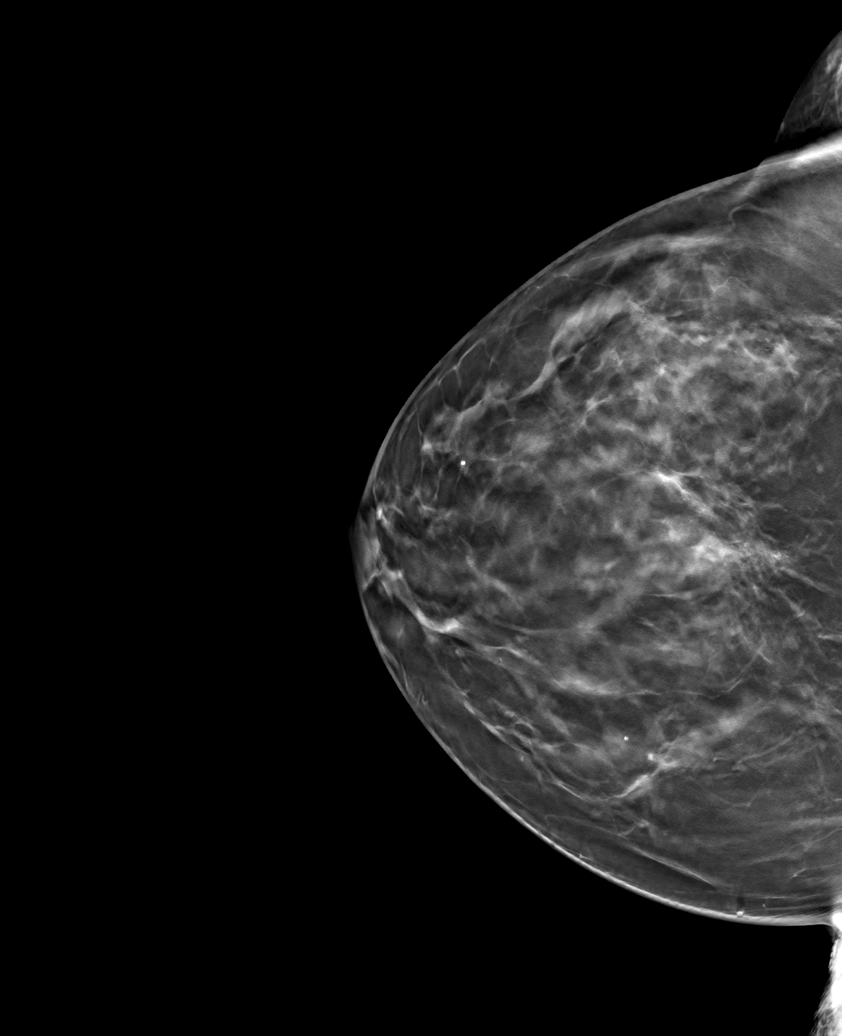

[6 of 14 positions shown; findings below may reference images not displayed]

ACR Breast Density Category c: The breast tissue is heterogeneously
dense, which may obscure small masses.
FINDINGS: Diffuse punctate calcifications in the upper-outer quadrant of the
right breast are mammographically stable. No suspicious mass or
malignant type microcalcifications identified in the right breast.

Mammographic images were processed with CAD.
IMPRESSION: Stable benign-appearing calcifications in the right breast.

RECOMMENDATION:
Bilateral diagnostic mammogram in 1 year is recommended.

I have discussed the findings and recommendations with the patient.
If applicable, a reminder letter will be sent to the patient
regarding the next appointment.

BI-RADS CATEGORY  3: Probably benign.

## 2022-03-19 ENCOUNTER — Telehealth: Payer: Self-pay

## 2022-03-19 NOTE — Telephone Encounter (Signed)
Pt needing her mammogram, but is past due for annual . Can yall help schedule and then we can put in order for mammo under the providers name she is seeing? She was an [facility] patient.

## 2022-03-19 NOTE — Telephone Encounter (Signed)
I contacted patient via phone. I spoke with patient about scheduling appointment. Patient states she will need to call back in one or two days to scheduled appointment.

## 2022-04-04 NOTE — Progress Notes (Unsigned)
PCP: Derinda Late, MD   No chief complaint on file.   HPI:      [name] is a 61 y.o. G3P3 whose LMP was No LMP recorded. Patient is postmenopausal., presents today for her annual examination.  Her menses are absent due to menopause. She {does:18564} have vasomotor sx.   Sex activity: {sex active: 315163}. She {does:18564} have vaginal dryness.  Last Pap: 01/11/21 Results were: no abnormalities /neg HPV DNA.  Hx of STDs: {STD hx:14358}  Last mammogram: 03/15/21  Results were: normal--routine follow-up in 12 months There is no FH of breast cancer. There is no FH of ovarian cancer. The patient {does:18564} do self-breast exams.  Colonoscopy: 8/22 at [facility] with polyps; Repeat due after 7 years.   Tobacco use: {tob:20664} Alcohol use: {Alcohol:11675} No drug use Exercise: {exercise:31265}  She {does:18564} get adequate calcium and Vitamin D in her diet.  Labs with PCP.   Patient Active Problem List   Diagnosis Date Noted   History of colonic polyps    Lipoma of colon    Polyp of descending colon     Past Surgical History:  Procedure Laterality Date   BREAST EXCISIONAL BIOPSY Bilateral 2001?   x3   BREAST SURGERY     CHOLECYSTECTOMY  2015   COLONOSCOPY WITH PROPOFOL N/A 04/05/2021   Procedure: COLONOSCOPY WITH PROPOFOL;  Surgeon: Virgel Manifold, MD;  Location: [facility] ENDOSCOPY;  Service: Endoscopy;  Laterality: N/A;   Right salpingectomy  2014    Family History  Problem Relation Age of Onset   Kidney cancer Maternal Grandfather     Social History   Socioeconomic History   Marital status: Married    Spouse name: Not on file   Number of children: Not on file   Years of education: Not on file   Highest education level: Not on file  Occupational History   Not on file  Tobacco Use   Smoking status: Never   Smokeless tobacco: Never  Vaping Use   Vaping Use: Never used  Substance and Sexual Activity   Alcohol use: Not Currently   Drug  use: Never   Sexual activity: Yes    Partners: Male    Birth control/protection: Surgical    Comment: Vasectomy  Other Topics Concern   Not on file  Social History Narrative   Not on file   Social Determinants of Health   Financial Resource Strain: Not on file  Food Insecurity: Not on file  Transportation Needs: Not on file  Physical Activity: Inactive (01/10/2018)   Exercise Vital Sign    Days of Exercise per Week: 0 days    Minutes of Exercise per Session: 0 min  Stress: Not on file  Social Connections: Not on file  Intimate Partner Violence: Not on file     Current Outpatient Medications:    aspirin EC 81 MG tablet, Take by mouth., Disp: , Rfl:    atorvastatin (LIPITOR) 10 MG tablet, Take 10 mg by mouth daily. , Disp: , Rfl:    atorvastatin (LIPITOR) 10 MG tablet, Take 1 tablet by mouth daily., Disp: , Rfl:    Cholecalciferol 25 MCG (1000 UT) tablet, Take by mouth., Disp: , Rfl:    CINNAMON PO, Take 1 tablet by mouth daily., Disp: , Rfl:    losartan-hydrochlorothiazide (HYZAAR) 50-12.5 MG tablet, Take 1 tablet by mouth daily. , Disp: , Rfl: 1   metFORMIN (GLUCOPHAGE-XR) 500 MG 24 hr tablet, TAKE 1 TABLET BY MOUTH DAILY WITH  DINNER., Disp: , Rfl:    Multiple Vitamin (MULTIVITAMIN) capsule, Take 1 capsule by mouth daily. , Disp: , Rfl:    Turmeric Curcumin 500 MG CAPS, Take 1 capsule by mouth daily. , Disp: , Rfl:    venlafaxine XR (EFFEXOR-XR) 37.5 MG 24 hr capsule, TAKE 1 CAPSULE (37.5 MG) BY ORAL ROUTE ONCE DAILY WITH FOOD, Disp: 90 capsule, Rfl: 2   vitamin B-12 (CYANOCOBALAMIN) 1000 MCG tablet, Take 1,000 mcg by mouth daily. , Disp: , Rfl:      ROS:  Review of Systems BREAST: No symptoms    Objective: There were no vitals taken for this visit.   OBGyn Exam  Results: No results found for this or any previous visit (from the past 24 hour(s)).  Assessment/Plan:  No diagnosis found.   No orders of the defined types were placed in this encounter.            GYN counsel {counseling: 16159}    F/U  No follow-ups on file.  Judi Jaffe B. Sarah Baez, PA-C 04/04/2022 7:57 PM

## 2022-04-05 ENCOUNTER — Encounter: Payer: Self-pay | Admitting: Obstetrics and Gynecology

## 2022-04-05 ENCOUNTER — Ambulatory Visit (INDEPENDENT_AMBULATORY_CARE_PROVIDER_SITE_OTHER): Payer: BC Managed Care – PPO | Admitting: Obstetrics and Gynecology

## 2022-04-05 VITALS — BP 112/70 | Ht 61.0 in | Wt 138.0 lb

## 2022-04-05 DIAGNOSIS — Z01419 Encounter for gynecological examination (general) (routine) without abnormal findings: Secondary | ICD-10-CM

## 2022-04-05 DIAGNOSIS — Z1231 Encounter for screening mammogram for malignant neoplasm of breast: Secondary | ICD-10-CM | POA: Diagnosis not present

## 2022-04-05 NOTE — Patient Instructions (Addendum)
I value your feedback and you entrusting us with your care. If you get a  patient survey, I would appreciate you taking the time to let us know about your experience today. Thank you!  [facility]: [phone]      

## 2022-05-16 ENCOUNTER — Ambulatory Visit
Admission: RE | Admit: 2022-05-16 | Discharge: 2022-05-16 | Disposition: A | Discharge status: Home / Self Care | Payer: BC Managed Care – PPO | Source: Ambulatory Visit | Attending: Obstetrics and Gynecology | Admitting: Obstetrics and Gynecology

## 2022-05-16 DIAGNOSIS — Z1231 Encounter for screening mammogram for malignant neoplasm of breast: Secondary | ICD-10-CM | POA: Diagnosis not present

## 2022-05-17 ENCOUNTER — Other Ambulatory Visit: Payer: Self-pay | Admitting: Obstetrics and Gynecology

## 2022-05-17 DIAGNOSIS — R928 Other abnormal and inconclusive findings on diagnostic imaging of breast: Secondary | ICD-10-CM

## 2022-05-17 DIAGNOSIS — R921 Mammographic calcification found on diagnostic imaging of breast: Secondary | ICD-10-CM

## 2022-06-08 ENCOUNTER — Other Ambulatory Visit: Payer: Self-pay | Admitting: Obstetrics and Gynecology

## 2022-06-08 ENCOUNTER — Ambulatory Visit
Admission: RE | Admit: 2022-06-08 | Discharge: 2022-06-08 | Disposition: A | Discharge status: Home / Self Care | Payer: BC Managed Care – PPO | Source: Ambulatory Visit | Attending: Obstetrics and Gynecology | Admitting: Obstetrics and Gynecology

## 2022-06-08 DIAGNOSIS — R928 Other abnormal and inconclusive findings on diagnostic imaging of breast: Secondary | ICD-10-CM | POA: Diagnosis not present

## 2022-06-08 DIAGNOSIS — R921 Mammographic calcification found on diagnostic imaging of breast: Secondary | ICD-10-CM | POA: Diagnosis present

## 2022-06-08 DIAGNOSIS — N6311 Unspecified lump in the right breast, upper outer quadrant: Secondary | ICD-10-CM | POA: Insufficient documentation

## 2022-06-11 ENCOUNTER — Other Ambulatory Visit: Payer: Self-pay | Admitting: Obstetrics and Gynecology

## 2022-06-11 DIAGNOSIS — N6310 Unspecified lump in the right breast, unspecified quadrant: Secondary | ICD-10-CM

## 2022-06-11 DIAGNOSIS — R928 Other abnormal and inconclusive findings on diagnostic imaging of breast: Secondary | ICD-10-CM

## 2022-06-21 ENCOUNTER — Ambulatory Visit
Admission: RE | Admit: 2022-06-21 | Discharge: 2022-06-21 | Disposition: A | Discharge status: Home / Self Care | Payer: BC Managed Care – PPO | Source: Ambulatory Visit | Attending: Obstetrics and Gynecology | Admitting: Obstetrics and Gynecology

## 2022-06-21 DIAGNOSIS — N6310 Unspecified lump in the right breast, unspecified quadrant: Secondary | ICD-10-CM | POA: Insufficient documentation

## 2022-06-21 DIAGNOSIS — R928 Other abnormal and inconclusive findings on diagnostic imaging of breast: Secondary | ICD-10-CM | POA: Insufficient documentation

## 2022-06-21 HISTORY — PX: OTHER SURGICAL HISTORY: SHX169

## 2022-06-22 LAB — SURGICAL PATHOLOGY

## 2022-08-01 ENCOUNTER — Ambulatory Visit: Payer: BC Managed Care – PPO | Admitting: Dermatology

## 2022-08-01 DIAGNOSIS — L814 Other melanin hyperpigmentation: Secondary | ICD-10-CM

## 2022-08-01 DIAGNOSIS — L821 Other seborrheic keratosis: Secondary | ICD-10-CM

## 2022-08-01 DIAGNOSIS — Z1283 Encounter for screening for malignant neoplasm of skin: Secondary | ICD-10-CM

## 2022-08-01 DIAGNOSIS — L578 Other skin changes due to chronic exposure to nonionizing radiation: Secondary | ICD-10-CM | POA: Diagnosis not present

## 2022-08-01 DIAGNOSIS — D229 Melanocytic nevi, unspecified: Secondary | ICD-10-CM | POA: Diagnosis not present

## 2022-08-01 DIAGNOSIS — D485 Neoplasm of uncertain behavior of skin: Secondary | ICD-10-CM

## 2022-08-01 NOTE — Progress Notes (Signed)
   Follow-Up Visit   Subjective  [name] is a 61 y.o. female who presents for the following: Annual Exam (No history of skin cancer or abnormal moles - The patient presents for Total-Body Skin Exam (TBSE) for skin cancer screening and mole check.  The patient has spots, moles and lesions to be evaluated, some may be new or changing and the patient has concerns that these could be cancer./).  The following portions of the chart were reviewed this encounter and updated as appropriate:   Tobacco  Allergies  Meds  Problems  Med Hx  Surg Hx  Fam Hx     Review of Systems:  No other skin or systemic complaints except as noted in HPI or Assessment and Plan.  Objective  Well appearing patient in no apparent distress; mood and affect are within normal limits.  A full examination was performed including scalp, head, eyes, ears, nose, lips, neck, chest, axillae, abdomen, back, buttocks, bilateral upper extremities, bilateral lower extremities, hands, feet, fingers, toes, fingernails, and toenails. All findings within normal limits unless otherwise noted below.  Right Breast Irregular brown macule 0.6 cm        Assessment & Plan   Lentigines - Scattered tan macules - Due to sun exposure - Benign-appearing, observe - Recommend daily broad spectrum sunscreen SPF 30+ to sun-exposed areas, reapply every 2 hours as needed. - Call for any changes  Seborrheic Keratoses - Stuck-on, waxy, tan-brown papules and/or plaques  - Benign-appearing - Discussed benign etiology and prognosis. - Observe - Call for any changes  Melanocytic Nevi - Tan-brown and/or pink-flesh-colored symmetric macules and papules - Benign appearing on exam today - Observation - Call clinic for new or changing moles - Recommend daily use of broad spectrum spf 30+ sunscreen to sun-exposed areas.   Hemangiomas - Red papules - Discussed benign nature - Observe - Call for any changes  Actinic Damage -  Chronic condition, secondary to cumulative UV/sun exposure - diffuse scaly erythematous macules with underlying dyspigmentation - Recommend daily broad spectrum sunscreen SPF 30+ to sun-exposed areas, reapply every 2 hours as needed.  - Staying in the shade or wearing long sleeves, sun glasses (UVA+UVB protection) and wide brim hats (4-inch brim around the entire circumference of the hat) are also recommended for sun protection.  - Call for new or changing lesions.  Skin cancer screening performed today.  Neoplasm of uncertain behavior of skin Right Breast  Epidermal / dermal shaving  Lesion diameter (cm):  0.6 Informed consent: discussed and consent obtained   Timeout: patient name, date of birth, surgical site, and procedure verified   Procedure prep:  Patient was prepped and draped in usual sterile fashion Prep type:  Isopropyl alcohol Anesthesia: the lesion was anesthetized in a standard fashion   Anesthetic:  1% lidocaine w/ epinephrine 1-100,000 buffered w/ 8.4% NaHCO3 Instrument used: flexible razor blade   Hemostasis achieved with: pressure, aluminum chloride and electrodesiccation   Outcome: patient tolerated procedure well   Post-procedure details: sterile dressing applied and wound care instructions given   Dressing type: bandage and petrolatum    Specimen 1 - Surgical pathology Differential Diagnosis: Nevus vs dysplastic nevus Check Margins: No   Return in about 1 year (around 08/02/2023) for TBSE.  I, Ashok Cordia, CMA, am acting as scribe for Sarina Ser, MD . Documentation: I have reviewed the above documentation for accuracy and completeness, and I agree with the above.  Sarina Ser, MD

## 2022-08-01 NOTE — Patient Instructions (Signed)
Due to recent changes in healthcare laws, you may see results of your pathology and/or laboratory studies on MyChart before the doctors have had a chance to review them. We understand that in some cases there may be results that are confusing or concerning to you. Please understand that not all results are received at the same time and often the doctors may need to interpret multiple results in order to provide you with the best plan of care or course of treatment. Therefore, we ask that you please give us 2 business days to thoroughly review all your results before contacting the office for clarification. Should we see a critical lab result, you will be contacted sooner.   If You Need Anything After Your Visit  If you have any questions or concerns for your doctor, please call our main line at [phone] and press option 4 to reach your doctor's medical assistant. If no one answers, please leave a voicemail as directed and we will return your call as soon as possible. Messages left after 4 pm will be answered the following business day.   You may also send us a message via MyChart. We typically respond to MyChart messages within 1-2 business days.  For prescription refills, please ask your pharmacy to contact our office. Our fax number is [phone].  If you have an urgent issue when the clinic is closed that cannot wait until the next business day, you can page your doctor at the number below.    Please note that while we do our best to be available for urgent issues outside of office hours, we are not available 24/7.   If you have an urgent issue and are unable to reach us, you may choose to seek medical care at your doctor's office, retail clinic, urgent care center, or emergency room.  If you have a medical emergency, please immediately call 911 or go to the emergency department.  Pager Numbers  - Dr. Kowalski: [phone]  - Dr. Moye: [phone]  - Dr. Stewart:  [phone]  In the event of inclement weather, please call our main line at [phone] for an update on the status of any delays or closures.  Dermatology Medication Tips: Please keep the boxes that topical medications come in in order to help keep track of the instructions about where and how to use these. Pharmacies typically print the medication instructions only on the boxes and not directly on the medication tubes.   If your medication is too expensive, please contact our office at [phone] option 4 or send us a message through MyChart.   We are unable to tell what your co-pay for medications will be in advance as this is different depending on your insurance coverage. However, we may be able to find a substitute medication at lower cost or fill out paperwork to get insurance to cover a needed medication.   If a prior authorization is required to get your medication covered by your insurance company, please allow us 1-2 business days to complete this process.  Drug prices often vary depending on where the prescription is filled and some pharmacies may offer cheaper prices.  The website www.goodrx.com contains coupons for medications through different pharmacies. The prices here do not account for what the cost may be with help from insurance (it may be cheaper with your insurance), but the website can give you the price if you did not use any insurance.  - You can print the associated coupon and take it with   your prescription to the pharmacy.  - You may also stop by our office during regular business hours and pick up a GoodRx coupon card.  - If you need your prescription sent electronically to a different pharmacy, notify our office through [facility] MyChart or by phone at [phone] option 4.     Si Usted Necesita Algo Despus de Su Visita  Tambin puede enviarnos un mensaje a travs de MyChart. Por lo general respondemos a los mensajes de MyChart en el transcurso de 1 a 2  das hbiles.  Para renovar recetas, por favor pida a su farmacia que se ponga en contacto con nuestra oficina. Nuestro nmero de fax es el [phone].  Si tiene un asunto urgente cuando la clnica est cerrada y que no puede esperar hasta el siguiente da hbil, puede llamar/localizar a su doctor(a) al nmero que aparece a continuacin.   Por favor, tenga en cuenta que aunque hacemos todo lo posible para estar disponibles para asuntos urgentes fuera del horario de oficina, no estamos disponibles las 24 horas del da, los 7 das de la semana.   Si tiene un problema urgente y no puede comunicarse con nosotros, puede optar por buscar atencin mdica  en el consultorio de su doctor(a), en una clnica privada, en un centro de atencin urgente o en una sala de emergencias.  Si tiene una emergencia mdica, por favor llame inmediatamente al 911 o vaya a la sala de emergencias.  Nmeros de bper  - Dr. Kowalski: [phone]  - Dra. Moye: [phone]  - Dra. Stewart: [phone]  En caso de inclemencias del tiempo, por favor llame a nuestra lnea principal al [phone] para una actualizacin sobre el estado de cualquier retraso o cierre.  Consejos para la medicacin en dermatologa: Por favor, guarde las cajas en las que vienen los medicamentos de uso tpico para ayudarle a seguir las instrucciones sobre dnde y cmo usarlos. Las farmacias generalmente imprimen las instrucciones del medicamento slo en las cajas y no directamente en los tubos del medicamento.   Si su medicamento es muy caro, por favor, pngase en contacto con nuestra oficina llamando al [phone] y presione la opcin 4 o envenos un mensaje a travs de MyChart.   No podemos decirle cul ser su copago por los medicamentos por adelantado ya que esto es diferente dependiendo de la cobertura de su seguro. Sin embargo, es posible que podamos encontrar un medicamento sustituto a menor costo o llenar un formulario para que el  seguro cubra el medicamento que se considera necesario.   Si se requiere una autorizacin previa para que su compaa de seguros cubra su medicamento, por favor permtanos de 1 a 2 das hbiles para completar este proceso.  Los precios de los medicamentos varan con frecuencia dependiendo del lugar de dnde se surte la receta y alguna farmacias pueden ofrecer precios ms baratos.  El sitio web www.goodrx.com tiene cupones para medicamentos de diferentes farmacias. Los precios aqu no tienen en cuenta lo que podra costar con la ayuda del seguro (puede ser ms barato con su seguro), pero el sitio web puede darle el precio si no utiliz ningn seguro.  - Puede imprimir el cupn correspondiente y llevarlo con su receta a la farmacia.  - Tambin puede pasar por nuestra oficina durante el horario de atencin regular y recoger una tarjeta de cupones de GoodRx.  - Si necesita que su receta se enve electrnicamente a una farmacia diferente, informe a nuestra oficina a travs de MyChart de [place]   o por telfono llamando al [phone] y presione la opcin 4.  

## 2022-08-12 ENCOUNTER — Encounter: Payer: Self-pay | Admitting: Dermatology

## 2022-08-13 ENCOUNTER — Telehealth: Payer: Self-pay

## 2022-08-13 NOTE — Telephone Encounter (Signed)
Left message for patient to call office for results/hd 

## 2022-08-13 NOTE — Telephone Encounter (Signed)
-----   Message from Ralene Bathe, MD sent at 08/11/2022  2:10 PM EST ----- Diagnosis Skin , right breast HEMANGIOMA  Benign hemangioma = collection of blood vessels No further treatment needed.

## 2022-08-14 ENCOUNTER — Telehealth: Payer: Self-pay

## 2022-08-14 NOTE — Telephone Encounter (Signed)
-----   Message from Ralene Bathe, MD sent at 08/11/2022  2:10 PM EST ----- Diagnosis Skin , right breast HEMANGIOMA  Benign hemangioma = collection of blood vessels No further treatment needed.

## 2022-08-14 NOTE — Telephone Encounter (Signed)
Discussed biopsy results with pt  °

## 2023-07-01 ENCOUNTER — Telehealth: Payer: Self-pay

## 2023-07-01 DIAGNOSIS — Z1231 Encounter for screening mammogram for malignant neoplasm of breast: Secondary | ICD-10-CM

## 2023-07-01 NOTE — Telephone Encounter (Signed)
Pt calling for order for yearly mammogram at [facility].

## 2023-07-01 NOTE — Telephone Encounter (Signed)
Order placed  Pt can schedule

## 2023-07-02 NOTE — Telephone Encounter (Signed)
Pt aware.

## 2023-08-07 ENCOUNTER — Ambulatory Visit: Payer: BC Managed Care – PPO | Admitting: Dermatology

## 2023-08-07 DIAGNOSIS — L729 Follicular cyst of the skin and subcutaneous tissue, unspecified: Secondary | ICD-10-CM

## 2023-08-07 DIAGNOSIS — L814 Other melanin hyperpigmentation: Secondary | ICD-10-CM

## 2023-08-07 DIAGNOSIS — L578 Other skin changes due to chronic exposure to nonionizing radiation: Secondary | ICD-10-CM | POA: Diagnosis not present

## 2023-08-07 DIAGNOSIS — Z7189 Other specified counseling: Secondary | ICD-10-CM

## 2023-08-07 DIAGNOSIS — D1721 Benign lipomatous neoplasm of skin and subcutaneous tissue of right arm: Secondary | ICD-10-CM

## 2023-08-07 DIAGNOSIS — L821 Other seborrheic keratosis: Secondary | ICD-10-CM

## 2023-08-07 DIAGNOSIS — W908XXA Exposure to other nonionizing radiation, initial encounter: Secondary | ICD-10-CM

## 2023-08-07 DIAGNOSIS — D229 Melanocytic nevi, unspecified: Secondary | ICD-10-CM

## 2023-08-07 DIAGNOSIS — Z1283 Encounter for screening for malignant neoplasm of skin: Secondary | ICD-10-CM | POA: Diagnosis not present

## 2023-08-07 DIAGNOSIS — D1801 Hemangioma of skin and subcutaneous tissue: Secondary | ICD-10-CM

## 2023-08-07 DIAGNOSIS — L720 Epidermal cyst: Secondary | ICD-10-CM

## 2023-08-07 DIAGNOSIS — Z79899 Other long term (current) drug therapy: Secondary | ICD-10-CM

## 2023-08-07 NOTE — Progress Notes (Signed)
   Follow-Up Visit   Subjective  [name] is a 62 y.o. female who presents for the following: Skin Cancer Screening and Full Body Skin Exam  The patient presents for Total-Body Skin Exam (TBSE) for skin cancer screening and mole check. The patient has spots, moles and lesions to be evaluated, some may be new or changing and the patient may have concern these could be cancer.    The following portions of the chart were reviewed this encounter and updated as appropriate: medications, allergies, medical history  Review of Systems:  No other skin or systemic complaints except as noted in HPI or Assessment and Plan.  Objective  Well appearing patient in no apparent distress; mood and affect are within normal limits.  A full examination was performed including scalp, head, eyes, ears, nose, lips, neck, chest, axillae, abdomen, back, buttocks, bilateral upper extremities, bilateral lower extremities, hands, feet, fingers, toes, fingernails, and toenails. All findings within normal limits unless otherwise noted below.   Relevant physical exam findings are noted in the Assessment and Plan.     Assessment & Plan   SKIN CANCER SCREENING PERFORMED TODAY.  ACTINIC DAMAGE - Chronic condition, secondary to cumulative UV/sun exposure - diffuse scaly erythematous macules with underlying dyspigmentation - Recommend daily broad spectrum sunscreen SPF 30+ to sun-exposed areas, reapply every 2 hours as needed.  - Staying in the shade or wearing long sleeves, sun glasses (UVA+UVB protection) and wide brim hats (4-inch brim around the entire circumference of the hat) are also recommended for sun protection.  - Call for new or changing lesions.  LENTIGINES, SEBORRHEIC KERATOSES, HEMANGIOMAS - Benign normal skin lesions - Benign-appearing - Call for any changes  MELANOCYTIC NEVI - Tan-brown and/or pink-flesh-colored symmetric macules and papules - Benign appearing on exam today -  Observation - Call clinic for new or changing moles - Recommend daily use of broad spectrum spf 30+ sunscreen to sun-exposed areas.   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 Exam most consistent with a lipoma. Discussed that a lipoma is a benign fatty growth that can grow over time and sometimes get irritated. Recommend observation if it is not bothersome or changing. Discussed option of ILK injections or surgical excision to remove it if it is growing, symptomatic, or other changes noted. Please call for new or changing lesions so they can be evaluated.  MILIA Exam: tiny erythematous firm white papule  Discussed this is a type of cyst. Benign-appearing. Sometimes these will clear with OTC adapalene/Differin 0.1% cream QHS or retinol.  Discussed extraction if symptomatic.  Treatment Plan: OTC Differin    Return in about 1 year (around 08/06/2024) for TBSE.  IAsher Muir, CMA, am acting as scribe for Armida Sans, MD.   Documentation: I have reviewed the above documentation for accuracy and completeness, and I agree with the above.  Armida Sans, MD

## 2023-08-07 NOTE — Patient Instructions (Addendum)
Seborrheic Keratosis  What causes seborrheic keratoses? Seborrheic keratoses are harmless, common skin growths that first appear during adult life.  As time goes by, more growths appear.  Some people may develop a large number of them.  Seborrheic keratoses appear on both covered and uncovered body parts.  They are not caused by sunlight.  The tendency to develop seborrheic keratoses can be inherited.  They vary in color from skin-colored to gray, brown, or even black.  They can be either smooth or have a rough, warty surface.   Seborrheic keratoses are superficial and look as if they were stuck on the skin.  Under the microscope this type of keratosis looks like layers upon layers of skin.  That is why at times the top layer may seem to fall off, but the rest of the growth remains and re-grows.    Treatment Seborrheic keratoses do not need to be treated, but can easily be removed in the office.  Seborrheic keratoses often cause symptoms when they rub on clothing or jewelry.  Lesions can be in the way of shaving.  If they become inflamed, they can cause itching, soreness, or burning.  Removal of a seborrheic keratosis can be accomplished by freezing, burning, or surgery. If any spot bleeds, scabs, or grows rapidly, please return to have it checked, as these can be an indication of a skin cancer.    Melanoma ABCDEs  Melanoma is the most dangerous type of skin cancer, and is the leading cause of death from skin disease.  You are more likely to develop melanoma if you: Have light-colored skin, light-colored eyes, or red or blond hair Spend a lot of time in the sun Tan regularly, either outdoors or in a tanning bed Have had blistering sunburns, especially during childhood Have a close family member who has had a melanoma Have atypical moles or large birthmarks  Early detection of melanoma is key since treatment is typically straightforward and cure rates are extremely high if we catch it early.    The first sign of melanoma is often a change in a mole or a new dark spot.  The ABCDE system is a way of remembering the signs of melanoma.  A for asymmetry:  The two halves do not match. B for border:  The edges of the growth are irregular. C for color:  A mixture of colors are present instead of an even brown color. D for diameter:  Melanomas are usually (but not always) greater than 6mm - the size of a pencil eraser. E for evolution:  The spot keeps changing in size, shape, and color.  Please check your skin once per month between visits. You can use a small mirror in front and a large mirror behind you to keep an eye on the back side or your body.   If you see any new or changing lesions before your next follow-up, please call to schedule a visit.  Please continue daily skin protection including broad spectrum sunscreen SPF 30+ to sun-exposed areas, reapplying every 2 hours as needed when you're outdoors.   Staying in the shade or wearing long sleeves, sun glasses (UVA+UVB protection) and wide brim hats (4-inch brim around the entire circumference of the hat) are also recommended for sun protection.    Due to recent changes in healthcare laws, you may see results of your pathology and/or laboratory studies on MyChart before the doctors have had a chance to review them. We understand that in some cases there  may be results that are confusing or concerning to you. Please understand that not all results are received at the same time and often the doctors may need to interpret multiple results in order to provide you with the best plan of care or course of treatment. Therefore, we ask that you please give [place] 2 business days to thoroughly review all your results before contacting the office for clarification. Should we see a critical lab result, you will be contacted sooner.   If You Need Anything After Your Visit  If you have any questions or concerns for your doctor, please call our main  line at [phone] and press option 4 to reach your doctor's medical assistant. If no one answers, please leave a voicemail as directed and we will return your call as soon as possible. Messages left after 4 pm will be answered the following business day.   You may also send [place] a message via MyChart. We typically respond to MyChart messages within 1-2 business days.  For prescription refills, please ask your pharmacy to contact our office. Our fax number is [phone].  If you have an urgent issue when the clinic is closed that cannot wait until the next business day, you can page your doctor at the number below.    Please note that while we do our best to be available for urgent issues outside of office hours, we are not available 24/7.   If you have an urgent issue and are unable to reach [place], you may choose to seek medical care at your doctor's office, retail clinic, urgent care center, or emergency room.  If you have a medical emergency, please immediately call 911 or go to the emergency department.  Pager Numbers  - Dr. Gwen Pounds: [phone]  - Dr. Roseanne Reno: [phone]  - Dr. Katrinka Blazing: [phone]   In the event of inclement weather, please call our main line at [phone] for an update on the status of any delays or closures.  Dermatology Medication Tips: Please keep the boxes that topical medications come in in order to help keep track of the instructions about where and how to use these. Pharmacies typically print the medication instructions only on the boxes and not directly on the medication tubes.   If your medication is too expensive, please contact our office at [phone] option 4 or send [place] a message through MyChart.   We are unable to tell what your co-pay for medications will be in advance as this is different depending on your insurance coverage. However, we may be able to find a substitute medication at lower cost or fill out paperwork to get insurance to cover  a needed medication.   If a prior authorization is required to get your medication covered by your insurance company, please allow [place] 1-2 business days to complete this process.  Drug prices often vary depending on where the prescription is filled and some pharmacies may offer cheaper prices.  The website www.goodrx.com contains coupons for medications through different pharmacies. The prices here do not account for what the cost may be with help from insurance (it may be cheaper with your insurance), but the website can give you the price if you did not use any insurance.  - You can print the associated coupon and take it with your prescription to the pharmacy.  - You may also stop by our office during regular business hours and pick up a GoodRx coupon card.  - If you need your prescription sent electronically  to a different pharmacy, notify our office through [facility] or by phone at [phone] option 4.     Si Usted Necesita Algo Despus de Su Visita  Tambin puede enviarnos un mensaje a travs de Clinical cytogeneticist. Por lo general respondemos a los mensajes de MyChart en el transcurso de 1 a 2 das hbiles.  Para renovar recetas, por favor pida a su farmacia que se ponga en contacto con nuestra oficina. Annie Sable de fax es [place] [phone].  Si tiene un asunto urgente cuando la clnica est cerrada y que no puede esperar hasta el siguiente da hbil, puede llamar/localizar a su doctor(a) al nmero que aparece a continuacin.   Por favor, tenga en cuenta que aunque hacemos todo lo posible para estar disponibles para asuntos urgentes fuera del horario de [place], no estamos disponibles las 24 horas del da, los 7 [address]  [address] de la [place].   Si tiene un problema urgente y no puede comunicarse con nosotros, puede optar por buscar atencin mdica  en el consultorio de su doctor(a), en una clnica privada, en un centro de atencin urgente o en una sala de emergencias.  Si tiene Psychologist, clinical, por favor llame inmediatamente al 911 o vaya a la sala de emergencias.  Nmeros de bper  - Dr. Gwen Pounds: [phone]  - Dra. Roseanne Reno: [phone]  - Dr. Katrinka Blazing: [phone]   En caso de inclemencias del tiempo, por favor llame a Lacy Duverney principal al [phone] para una actualizacin sobre el [place] de cualquier retraso o cierre.  Consejos para la medicacin en dermatologa: Por favor, guarde las cajas en las que vienen los medicamentos de uso tpico para ayudarle a seguir las instrucciones sobre dnde y cmo usarlos. Las farmacias generalmente imprimen las instrucciones del medicamento slo en las cajas y no directamente en los tubos del [place].   Si su medicamento es muy caro, por favor, pngase en contacto con Rolm Gala llamando al [phone] y presione la opcin 4 o envenos un mensaje a travs de Clinical cytogeneticist.   No podemos decirle cul ser su copago por los medicamentos por adelantado ya que esto es diferente dependiendo de la cobertura de su seguro. Sin embargo, es posible que podamos encontrar un medicamento sustituto a Audiological scientist un formulario para que el seguro cubra el medicamento que se considera necesario.   Si se requiere una autorizacin previa para que su compaa de seguros [place] su medicamento, por favor permtanos de 1 a 2 das hbiles para completar [address]  Los precios de los medicamentos varan con frecuencia dependiendo del Environmental consultant de dnde se surte la receta y alguna farmacias pueden ofrecer precios ms baratos.  El sitio web www.goodrx.com tiene cupones para medicamentos de Health and safety inspector. Los precios aqu no tienen en cuenta lo que podra costar con la ayuda del seguro (puede ser ms barato con su seguro), pero el sitio web puede darle el precio si no utiliz Tourist information centre manager.  - Puede imprimir el cupn correspondiente y llevarlo con su receta a la farmacia.  - Tambin puede pasar por nuestra oficina durante el horario de  atencin regular y Education officer, museum una tarjeta de cupones de GoodRx.  - Si necesita que su receta se enve electrnicamente a una farmacia diferente, informe a nuestra oficina a travs de MyChart de  o por telfono llamando al [phone] y presione la opcin 4.

## 2023-08-11 ENCOUNTER — Encounter: Payer: Self-pay | Admitting: Dermatology

## 2023-09-04 ENCOUNTER — Ambulatory Visit
Admission: RE | Admit: 2023-09-04 | Discharge: 2023-09-04 | Disposition: A | Discharge status: Home / Self Care | Payer: 59 | Source: Ambulatory Visit | Attending: Obstetrics and Gynecology | Admitting: Obstetrics and Gynecology

## 2023-09-04 DIAGNOSIS — Z1231 Encounter for screening mammogram for malignant neoplasm of breast: Secondary | ICD-10-CM | POA: Diagnosis present

## 2023-09-05 ENCOUNTER — Encounter: Payer: Self-pay | Admitting: Obstetrics and Gynecology

## 2024-03-26 ENCOUNTER — Encounter: Payer: Self-pay | Admitting: Dermatology

## 2024-03-26 ENCOUNTER — Other Ambulatory Visit: Payer: Self-pay

## 2024-03-26 ENCOUNTER — Ambulatory Visit: Admitting: Dermatology

## 2024-03-26 DIAGNOSIS — Z79899 Other long term (current) drug therapy: Secondary | ICD-10-CM

## 2024-03-26 DIAGNOSIS — B079 Viral wart, unspecified: Secondary | ICD-10-CM | POA: Diagnosis not present

## 2024-03-26 DIAGNOSIS — Z7189 Other specified counseling: Secondary | ICD-10-CM | POA: Diagnosis not present

## 2024-03-26 MED ORDER — FLUOROURACIL 5 % EX CREA: TOPICAL_CREAM | CUTANEOUS | 6 refills | Status: AC

## 2024-03-26 MED ORDER — FLUOROURACIL 5 % EX CREA: TOPICAL_CREAM | CUTANEOUS | 6 refills | Status: DC

## 2024-03-26 NOTE — Patient Instructions (Addendum)
 [facility] [phone]  Instructions for [facility] Medications  One or more of your medications was sent to the [facility] mail order compounding pharmacy. You will receive an email from them and can purchase the medicine through that link. It will then be mailed to your home at the address you confirmed. If for any reason you do not receive an email from them, please check your spam folder. If you still do not find the email, please let us  know. [facility] phone number is [phone].  Due to recent changes in healthcare laws, you may see results of your pathology and/or laboratory studies on MyChart before the doctors have had a chance to review them. We understand that in some cases there may be results that are confusing or concerning to you. Please understand that not all results are received at the same time and often the doctors may need to interpret multiple results in order to provide you with the best plan of care or course of treatment. Therefore, we ask that you please give us  2 business days to thoroughly review all your results before contacting the office for clarification. Should we see a critical lab result, you will be contacted sooner.   If You Need Anything After Your Visit  If you have any questions or concerns for your doctor, please call our main line at [phone] and press option 4 to reach your doctor's medical assistant. If no one answers, please leave a voicemail as directed and we will return your call as soon as possible. Messages left after 4 pm will be answered the following business day.   You may also send us  a message via MyChart. We typically respond to MyChart messages within 1-2 business days.  For prescription refills, please ask your pharmacy to contact our office. Our fax number is [phone].  If you have an urgent issue when the clinic is closed that cannot wait until the next business day, you can page your doctor at  the number below.    Please note that while we do our best to be available for urgent issues outside of office hours, we are not available 24/7.   If you have an urgent issue and are unable to reach us , you may choose to seek medical care at your doctor's office, retail clinic, urgent care center, or emergency room.  If you have a medical emergency, please immediately call 911 or go to the emergency department.  Pager Numbers  - Dr. Hester: [phone]  - Dr. Jackquline: [phone]  - Dr. Claudene: [phone]   In the event of inclement weather, please call our main line at [phone] for an update on the status of any delays or closures.  Dermatology Medication Tips: Please keep the boxes that topical medications come in in order to help keep track of the instructions about where and how to use these. Pharmacies typically print the medication instructions only on the boxes and not directly on the medication tubes.   If your medication is too expensive, please contact our office at [phone] option 4 or send us  a message through MyChart.   We are unable to tell what your co-pay for medications will be in advance as this is different depending on your insurance coverage. However, we may be able to find a substitute medication at lower cost or fill out paperwork to get insurance to cover a needed medication.   If a prior authorization is required to get your medication covered by  your insurance company, please allow us  1-2 business days to complete this process.  Drug prices often vary depending on where the prescription is filled and some pharmacies may offer cheaper prices.  The website www.goodrx.com contains coupons for medications through different pharmacies. The prices here do not account for what the cost may be with help from insurance (it may be cheaper with your insurance), but the website can give you the price if you did not use any insurance.  - You can print the  associated coupon and take it with your prescription to the pharmacy.  - You may also stop by our office during regular business hours and pick up a GoodRx coupon card.  - If you need your prescription sent electronically to a different pharmacy, notify our office through [facility] or by phone at [phone] option 4.     Si Usted Necesita Algo Despus de Su Visita  Tambin puede enviarnos un mensaje a travs de Clinical cytogeneticist. Por lo general respondemos a los mensajes de MyChart en el transcurso de 1 a 2 das hbiles.  Para renovar recetas, por favor pida a su farmacia que se ponga en contacto con nuestra oficina. Randi lakes de fax es [place] [phone].  Si tiene un asunto urgente cuando la clnica est cerrada y que no puede esperar hasta el siguiente da hbil, puede llamar/localizar a su doctor(a) al nmero que aparece a continuacin.   Por favor, tenga en cuenta que aunque hacemos todo lo posible para estar disponibles para asuntos urgentes fuera del horario de [place], no estamos disponibles las 24 horas del da, los 7 [address]  [address] de la [place].   Si tiene un problema urgente y no puede comunicarse con nosotros, puede optar por buscar atencin mdica  en el consultorio de su doctor(a), en una clnica privada, en un centro de atencin urgente o en una sala de emergencias.  Si tiene Engineer, drilling, por favor llame inmediatamente al 911 o vaya a la sala de emergencias.  Nmeros de bper  - Dr. Hester: [phone]  - Dra. Jackquline: [phone]  - Dr. Claudene: [phone]   En caso de inclemencias del tiempo, por favor llame a landry capes principal al [phone] para una actualizacin sobre el [place] de cualquier retraso o cierre.  Consejos para la medicacin en dermatologa: Por favor, guarde las cajas en las que vienen los medicamentos de uso tpico para ayudarle a seguir las instrucciones sobre dnde y cmo usarlos. Las farmacias generalmente imprimen las instrucciones  del medicamento slo en las cajas y no directamente en los tubos del [place].   Si su medicamento es muy caro, por favor, pngase en contacto con landry rieger llamando al [phone] y presione la opcin 4 o envenos un mensaje a travs de Clinical cytogeneticist.   No podemos decirle cul ser su copago por los medicamentos por adelantado ya que esto es diferente dependiendo de la cobertura de su seguro. Sin embargo, es posible que podamos encontrar un medicamento sustituto a Audiological scientist un formulario para que el seguro cubra el medicamento que se considera necesario.   Si se requiere una autorizacin previa para que su compaa de seguros malta su medicamento, por favor permtanos de 1 a 2 das hbiles para completar este proceso.  Los precios de los medicamentos varan con frecuencia dependiendo del Environmental consultant de dnde se surte la receta y alguna farmacias pueden ofrecer precios ms baratos.  El sitio web www.goodrx.com tiene cupones para medicamentos de Health and safety inspector. Los precios aqu no  tienen en cuenta lo que podra costar con la ayuda del seguro (puede ser ms barato con su seguro), pero el sitio web puede darle el precio si no Visual merchandiser.  - Puede imprimir el cupn correspondiente y llevarlo con su receta a la farmacia.  - Tambin puede pasar por nuestra oficina durante el horario de atencin regular y Education officer, museum una tarjeta de cupones de GoodRx.  - Si necesita que su receta se enve electrnicamente a una farmacia diferente, informe a nuestra oficina a travs de MyChart de  o por telfono llamando al [phone] y presione la opcin 4.

## 2024-03-26 NOTE — Progress Notes (Signed)
 Follow-Up Visit   Subjective  [name] is a 63 y.o. female who presents for the following: Warts on the R index finger and R 4th finger. Pt has been treating at home, but warts will not resolve, and treating at home has become painful.  The following portions of the chart were reviewed this encounter and updated as appropriate: medications, allergies, medical history  Review of Systems:  No other skin or systemic complaints except as noted in HPI or Assessment and Plan.  Objective  Well appearing patient in no apparent distress; mood and affect are within normal limits.  A focused examination was performed of the following areas: the hands   Relevant exam findings are noted in the Assessment and Plan.  R 4th finger at DIP joint  x 1, R index finger in the periunguinal and eponychium x 1 (2) 1.0 cm verrucous papule. 1.2 x 0.4 cm verrucous plaque.    Assessment & Plan   VIRAL WARTS, UNSPECIFIED TYPE (2) R 4th finger at DIP joint  x 1, R index finger in the periunguinal and eponychium x 1 (2) Viral Wart (HPV) Counseling  Discussed viral / HPV (Human Papilloma Virus) etiology and risk of spread /infectivity to other areas of body as well as to other people.  Multiple treatments and methods may be required to clear warts and it is possible treatment may not be successful.  Treatment risks include discoloration; scarring and there is still potential for wart recurrence.  Start prescription 5-fluorouracil /salicylic acid wart paste from [facility] nightly under occlusion. Reviewed risk of irritation and risk scarring if applied to normal skin. If irritation develops, stop medication for a few days until area calm, then restart a very small amount just to the wart. This medication cannot be used by pregnant women. Patient advised they will receive an email from the [facility] pharmacy and can purchase the medication online through a link in the email.  Squaric Acid is used  for immunotherapy for treatment of warts and other skin conditions.   For sensitization, Squaric Acid 3% is applied to L upper inner arm and covered with band aid. The bandage with Squaric Acid should be left on 12-24 hours, then washed off thoroughly with soap and water and rinse. Patient advised they may get a red rash at the area over then next 4 weeks.  The rash is the desired result in order to more likely create a therapeutic result at the treatment site. A topical steroid or similar cream may be used to treat the rash area on arm if necessary.  Destruction of lesion - R 4th finger at DIP joint  x 1, R index finger in the periunguinal and eponychium x 1 (2) Complexity: simple   Destruction method: cryotherapy   Informed consent: discussed and consent obtained   Timeout:  patient name, date of birth, surgical site, and procedure verified Lesion destroyed using liquid nitrogen: Yes   Region frozen until ice ball extended beyond lesion: Yes   Outcome: patient tolerated procedure well with no complications   Post-procedure details: wound care instructions given    Destruction of lesion - R 4th finger at DIP joint  x 1, R index finger in the periunguinal and eponychium x 1 (2) Complexity: simple   Destruction method: chemical removal   Chemical destruction method comment:  Squaric acid 3%, cantharidin plus Application time:  4 hours Procedure instructions: patient instructed to wash and dry area   Outcome: patient tolerated procedure well  with no complications     Return in about 6 weeks (around 05/07/2024) for wart follow up.  LILLETTE Rosina Mayans, CMA, am acting as scribe for Alm Rhyme, MD .   Documentation: I have reviewed the above documentation for accuracy and completeness, and I agree with the above.  Alm Rhyme, MD

## 2024-03-26 NOTE — Progress Notes (Signed)
Corrected prescription

## 2024-05-12 ENCOUNTER — Ambulatory Visit: Admitting: Dermatology

## 2024-05-12 DIAGNOSIS — L235 Allergic contact dermatitis due to other chemical products: Secondary | ICD-10-CM | POA: Diagnosis not present

## 2024-05-12 DIAGNOSIS — Z7189 Other specified counseling: Secondary | ICD-10-CM

## 2024-05-12 DIAGNOSIS — B078 Other viral warts: Secondary | ICD-10-CM

## 2024-05-12 NOTE — Progress Notes (Unsigned)
   Follow-Up Visit   Subjective  [name] is a 63 y.o. female who presents for the following: 6 weeks f/u  Warts on the R index finger and R 4th finger. Pt has been treating at home with [facility] wart paste with a fair response, patient had LN2, squaric acid and Cantharidin applied 6 weeks ago with a good response. Patient report reaction on her left arm from squaric sensitization.    The following portions of the chart were reviewed this encounter and updated as appropriate: medications, allergies, medical history  Review of Systems:  No other skin or systemic complaints except as noted in HPI or Assessment and Plan.  Objective  Well appearing patient in no apparent distress; mood and affect are within normal limits.  A focused examination was performed of the following areas: right index finger, right 4th finger   Relevant exam findings are noted in the Assessment and Plan.  R 4th finger at DIP joint  x 1, Verrucous papules -- Discussed viral etiology and contagion.   Assessment & Plan   OTHER VIRAL WARTS R 4th finger at DIP joint  x 1, Viral Wart (HPV) Counseling  Discussed viral / HPV (Human Papilloma Virus) etiology and risk of spread /infectivity to other areas of body as well as to other people.  Multiple treatments and methods may be required to clear warts and it is possible treatment may not be successful.  Treatment risks include discoloration; scarring and there is still potential for wart recurrence.   R 4th finger at DIP joint treated with LN2, Squaric acid and Cantharidin  R index finger in the periunguinal and eponychium treated with Squaric acid only    May consider Candida antigen injections on followed by food if needed  Continue [facility] wart paste at bedtime  Destruction of lesion - R 4th finger at DIP joint  x 1, Complexity: simple   Destruction method: cryotherapy   Informed consent: discussed and consent obtained   Timeout:  patient  name, date of birth, surgical site, and procedure verified Lesion destroyed using liquid nitrogen: Yes   Region frozen until ice ball extended beyond lesion: Yes   Cryotherapy cycles:  2 Outcome: patient tolerated procedure well with no complications   Post-procedure details: wound care instructions given    Destruction of lesion - R 4th finger at DIP joint  x 1,  Destruction method: chemical removal   Destruction method comment:  Squaric acid Informed consent: discussed and consent obtained   Timeout:  patient name, date of birth, surgical site, and procedure verified Chemical destruction method: cantharidin   Procedure instructions: patient instructed to wash and dry area   Outcome: patient tolerated procedure well with no complications   Post-procedure details: wound care instructions given   Additional details:  Patient advised to set alarm to remind them to wash off with soap and water at the directed time.  COUNSELING AND COORDINATION OF CARE   ALLERGIC DERMATITIS DUE TO OTHER CHEMICAL PRODUCT    CONTACT DERMATITIS From squaric acid  Left upper arm- resolving pink area  No treatment needed   Return in about 6 weeks (around 06/23/2024) for warts.  IFay Kirks, CMA, am acting as scribe for Alm Rhyme, MD .   Documentation: I have reviewed the above documentation for accuracy and completeness, and I agree with the above.  Alm Rhyme, MD

## 2024-05-12 NOTE — Patient Instructions (Signed)

## 2024-05-13 ENCOUNTER — Encounter: Payer: Self-pay | Admitting: Dermatology

## 2024-06-24 ENCOUNTER — Encounter: Payer: Self-pay | Admitting: Dermatology

## 2024-06-24 ENCOUNTER — Ambulatory Visit: Admitting: Dermatology

## 2024-06-24 DIAGNOSIS — B079 Viral wart, unspecified: Secondary | ICD-10-CM

## 2024-06-24 DIAGNOSIS — Z7189 Other specified counseling: Secondary | ICD-10-CM | POA: Diagnosis not present

## 2024-06-24 DIAGNOSIS — Z79899 Other long term (current) drug therapy: Secondary | ICD-10-CM

## 2024-06-24 NOTE — Patient Instructions (Signed)

## 2024-06-24 NOTE — Progress Notes (Signed)
 Follow-Up Visit   Subjective  [name] is a 63 y.o. female who presents for the following: 6 week wart follow up at R 4th finger. Was last treated with LN2, squaric acid and cantharidin. Patient thinks it has improved but has been painful. Also using SM wart paste at bedtime.   The following portions of the chart were reviewed this encounter and updated as appropriate: medications, allergies, medical history  Review of Systems:  No other skin or systemic complaints except as noted in HPI or Assessment and Plan.  Objective  Well appearing patient in no apparent distress; mood and affect are within normal limits.   A focused examination was performed of the following areas: Right hand and fingers  Relevant exam findings are noted in the Assessment and Plan.  R 4th finger at DIP joint  x 1, R index finger in the periunguinal and eponychium x 1 (2) (3) Verrucous papules -- Discussed viral etiology and contagion.   Assessment & Plan     VIRAL WARTS, UNSPECIFIED TYPE (3) R 4th finger at DIP joint  x 1, R index finger in the periunguinal and eponychium x 1 (2) (3) Viral Wart (HPV) Counseling  Discussed viral / HPV (Human Papilloma Virus) etiology and risk of spread /infectivity to other areas of body as well as to other people.  Multiple treatments and methods may be required to clear warts and it is possible treatment may not be successful.  Treatment risks include discoloration; scarring and there is still potential for wart recurrence.  Patient advised to d/c SM wart paste at home for 1 month. If patient notices residual wart may restart SM wart paste at bedtime.   Squaric Acid may be used as immunotherapy to treat warts and other skin conditions. Squaric Acid 3% applied to warts today. Areas for treated should be left covered for 4-12 hours, then areas should be uncovered and washed off thoroughly with soap and water and rinsed. The skin where Squaric Acid was applied may  get red and irritated.  This is the desired result in order to get a better resolution of warts.  It may require several treatments to achieve desired results. Prior to application reviewed risk of inflammation and irritation.  Destruction of lesion - R 4th finger at DIP joint  x 1, R index finger in the periunguinal and eponychium x 1 (2) (3)  Destruction method: chemical removal   Informed consent: discussed and consent obtained   Timeout:  patient name, date of birth, surgical site, and procedure verified Chemical destruction method: cantharidin   Application time:  4 hours Procedure instructions: patient instructed to wash and dry area   Outcome: patient tolerated procedure well with no complications   Post-procedure details: wound care instructions given   Additional details:  Cantharidin Plus is a blistering agent that comes from a beetle.  It needs to be washed off in about 4-6 hours after application.  Although it is painless when applied in office, it may cause symptoms of mild pain and burning several hours later.  Treated areas will swell and turn red, and blisters may form.  Vaseline and a bandaid may be applied until wound has healed.  Once healed, the skin may remain temporarily discolored.  It can take weeks to months for pigmentation to return to normal.  Advised to wash off with soap and water in 4-6 hours or sooner if it becomes tender before then.    Return in about 6 weeks (around  08/05/2024) for Warts, with Dr. LOIS LILLETTE Lonell Lorren, RMA, am acting as scribe for Alm Rhyme, MD .   Documentation: I have reviewed the above documentation for accuracy and completeness, and I agree with the above.  Alm Rhyme, MD

## 2024-06-30 ENCOUNTER — Encounter: Payer: Self-pay | Admitting: Dermatology

## 2024-08-04 ENCOUNTER — Encounter: Payer: Self-pay | Admitting: Dermatology

## 2024-08-04 ENCOUNTER — Ambulatory Visit: Admitting: Dermatology

## 2024-08-04 DIAGNOSIS — B079 Viral wart, unspecified: Secondary | ICD-10-CM

## 2024-08-04 MED ORDER — HPV 9-VALENT RECOMB VACCINE IM SUSP: 0.5000 mL | Freq: Once | INTRAMUSCULAR | 2 refills | Status: AC

## 2024-08-04 NOTE — Progress Notes (Unsigned)
   Follow-Up Visit   Subjective  [name] is a 63 y.o. female who presents for the following: wart 6wk f/u, R 4th DIP at joint, R index finger in the periunguinal and eponychium, SM wart past ~5n/wk, some improvement   The following portions of the chart were reviewed this encounter and updated as appropriate: medications, allergies, medical history  Review of Systems:  No other skin or systemic complaints except as noted in HPI or Assessment and Plan.  Objective  Well appearing patient in no apparent distress; mood and affect are within normal limits.   A focused examination was performed of the following areas: R hand  Relevant exam findings are noted in the Assessment and Plan.  R index finger at the lat nail fold and eponychium x 2, R 4th finter DIP at joint x 1 (3) Verrucous papules -- Discussed viral etiology and contagion.   Assessment & Plan     VIRAL WARTS, UNSPECIFIED TYPE (3) R index finger at the lat nail fold and eponychium x 2, R 4th finter DIP at joint x 1 (3) Viral Wart (HPV) Counseling  Discussed viral / HPV (Human Papilloma Virus) etiology and risk of spread /infectivity to other areas of body as well as to other people.  Multiple treatments and methods may be required to clear warts and it is possible treatment may not be successful.  Treatment risks include discoloration; scarring and there is still potential for wart recurrence.  Discussed Candida Injections Recommend Gardasil vaccine, prescription given to patient  LN2 x 3 Candida first injection to R 4th finger DIP at joint  Cont SM Wart paste ~5n/wk as tolerated Destruction of lesion - R index finger at the lat nail fold and eponychium x 2, R 4th finter DIP at joint x 1 (3) Complexity: simple   Destruction method: cryotherapy   Informed consent: discussed and consent obtained   Timeout:  patient name, date of birth, surgical site, and procedure verified Lesion destroyed using liquid nitrogen:  Yes   Region frozen until ice ball extended beyond lesion: Yes   Outcome: patient tolerated procedure well with no complications   Post-procedure details: wound care instructions given    Intralesional injection - R index finger at the lat nail fold and eponychium x 2, R 4th finter DIP at joint x 1 (3) Location: R 4th finger DIP at joint  Informed Consent: Discussed risks (infection, pain, bleeding, bruising, thinning of the skin, loss of skin pigment, lack of resolution, and recurrence of lesion) and benefits of the procedure, as well as the alternatives. Informed consent was obtained. Preparation: The area was prepared a standard fashion.  Procedure Details: An intralesional injection was performed with candida antigen. 0.1 cc in total were injected.  Total number of injections: 1  Plan: The patient was instructed on post-op care. Recommend OTC analgesia as needed for pain.  Candida NDC [address]-8 Lot [id number] Exp 12/17/226   Return for 4-6 wks Wart f/u.  I, Grayce Saunas, RMA, am acting as scribe for Alm Rhyme, MD .   Documentation: I have reviewed the above documentation for accuracy and completeness, and I agree with the above.  Alm Rhyme, MD

## 2024-08-04 NOTE — Patient Instructions (Addendum)
 CANDIDA ALBICANS IMMUNOTHERAPY OF WARTS INSTRUCTIONS   Candida immunotherapy injections for warts are usually quite safe and tolerated well by most people.However, mild to moderate itching, tenderness or swelling at the injection site is common and usually lasts 24 to 48 hours. If you are uncomfortable, you may:  Elevate the painful area Apply ice wrapped in a towel for five minutes on and five minutes off Take Tylenol every 4-6 hours (NOT ibuprofen, aspirin, Advil, Aleve or Motrin due to the anti-inflammatory properties, since the goal is to generate an inflammatory reaction to kill the wart virus)  Only 5% of Candida immunotherapy patients get flu-like symptoms (such as muscle and joint aches, chills, or headaches). Rarely, fever can also occur. Usually these symptoms last only 24 - 48 hours. If any of these symptoms occur, taking Tylenol every four to six hours really helps. This can be prevented in subsequent office visits by taking Tylenol BEFORE your next injection.(Remember, DO NOT take ibuprofen, aspirin, Advil, Aleve or Motrin).  Rarely,  widespread hives (itchy welts) may occur from this treatment. If this occurs, take oral Benadryl (may make drowsy) every four to six hours as needed for welts /hives.  Alternatively, you may take a non-sedating antihistamine such as Claritin, Allegra or Zyrtec for welts/hives.  Our phone number is [phone]. If you have any serious or persistent problems, please contact our office for further directions.     Cryotherapy Aftercare  Wash gently with soap and water everyday.   Apply Vaseline and Band-Aid daily until healed.   Due to recent changes in healthcare laws, you may see results of your pathology and/or laboratory studies on MyChart before the doctors have had a chance to review them. We understand that in some cases there may be results that are confusing or concerning to you. Please understand that not all results are received at the same  time and often the doctors may need to interpret multiple results in order to provide you with the best plan of care or course of treatment. Therefore, we ask that you please give us  2 business days to thoroughly review all your results before contacting the office for clarification. Should we see a critical lab result, you will be contacted sooner.   If You Need Anything After Your Visit  If you have any questions or concerns for your doctor, please call our main line at [phone] and press option 4 to reach your doctor's medical assistant. If no one answers, please leave a voicemail as directed and we will return your call as soon as possible. Messages left after 4 pm will be answered the following business day.   You may also send us  a message via MyChart. We typically respond to MyChart messages within 1-2 business days.  For prescription refills, please ask your pharmacy to contact our office. Our fax number is [phone].  If you have an urgent issue when the clinic is closed that cannot wait until the next business day, you can page your doctor at the number below.    Please note that while we do our best to be available for urgent issues outside of office hours, we are not available 24/7.   If you have an urgent issue and are unable to reach us , you may choose to seek medical care at your doctor's office, retail clinic, urgent care center, or emergency room.  If you have a medical emergency, please immediately call 911 or go to the emergency department.  Pager Numbers  -  Dr. Hester: [phone]  - Dr. Jackquline: [phone]  - Dr. Claudene: [phone]   - Dr. Raymund: [phone]  In the event of inclement weather, please call our main line at [phone] for an update on the status of any delays or closures.  Dermatology Medication Tips: Please keep the boxes that topical medications come in in order to help keep track of the instructions about where and how to use  these. Pharmacies typically print the medication instructions only on the boxes and not directly on the medication tubes.   If your medication is too expensive, please contact our office at [phone] option 4 or send us  a message through MyChart.   We are unable to tell what your co-pay for medications will be in advance as this is different depending on your insurance coverage. However, we may be able to find a substitute medication at lower cost or fill out paperwork to get insurance to cover a needed medication.   If a prior authorization is required to get your medication covered by your insurance company, please allow us  1-2 business days to complete this process.  Drug prices often vary depending on where the prescription is filled and some pharmacies may offer cheaper prices.  The website www.goodrx.com contains coupons for medications through different pharmacies. The prices here do not account for what the cost may be with help from insurance (it may be cheaper with your insurance), but the website can give you the price if you did not use any insurance.  - You can print the associated coupon and take it with your prescription to the pharmacy.  - You may also stop by our office during regular business hours and pick up a GoodRx coupon card.  - If you need your prescription sent electronically to a different pharmacy, notify our office through [facility] or by phone at [phone] option 4.     Si Usted Necesita Algo Despus de Su Visita  Tambin puede enviarnos un mensaje a travs de Clinical Cytogeneticist. Por lo general respondemos a los mensajes de MyChart en el transcurso de 1 a 2 das hbiles.  Para renovar recetas, por favor pida a su farmacia que se ponga en contacto con nuestra oficina. Randi lakes de fax es [place] [phone].  Si tiene un asunto urgente cuando la clnica est cerrada y que no puede esperar hasta el siguiente da hbil, puede llamar/localizar a su doctor(a) al  nmero que aparece a continuacin.   Por favor, tenga en cuenta que aunque hacemos todo lo posible para estar disponibles para asuntos urgentes fuera del horario de [place], no estamos disponibles las 24 horas del da, los 7 [address] de la [place].   Si tiene un problema urgente y no puede comunicarse con nosotros, puede optar por buscar atencin mdica  en el consultorio de su doctor(a), en una clnica privada, en un centro de atencin urgente o en una sala de emergencias.  Si tiene engineer, drilling, por favor llame inmediatamente al 911 o vaya a la sala de emergencias.  Nmeros de bper  - Dr. Hester: [phone]  - Dra. Jackquline: [phone]  - Dr. Claudene: [phone]  - Dra. Kitts: [phone]  En caso de inclemencias del [place], por favor llame a nuestra lnea principal al [phone] para una actualizacin sobre el estado de cualquier retraso o cierre.  Consejos para la medicacin en dermatologa: Por favor, guarde las cajas en las que vienen los medicamentos de uso tpico para ayudarle a seguir las instrucciones sobre dnde  y cmo usarlos. Las farmacias generalmente imprimen las instrucciones del medicamento slo en las cajas y no directamente en los tubos del [place].   Si su medicamento es muy caro, por favor, pngase en contacto con landry rieger llamando al [phone] y presione la opcin 4 o envenos un mensaje a travs de Clinical Cytogeneticist.   No podemos decirle cul ser su copago por los medicamentos por adelantado ya que esto es diferente dependiendo de la cobertura de su seguro. Sin embargo, es posible que podamos encontrar un medicamento sustituto a audiological scientist un formulario para que el seguro cubra el medicamento que se considera necesario.   Si se requiere una autorizacin previa para que su compaa de seguros cubra su medicamento, por favor permtanos de 1 a 2 das hbiles para completar este proceso.  Los precios de los medicamentos varan con frecuencia  dependiendo del environmental consultant de dnde se surte la receta y alguna farmacias pueden ofrecer precios ms baratos.  El sitio web www.goodrx.com tiene cupones para medicamentos de health and safety inspector. Los precios aqu no tienen en cuenta lo que podra costar con la ayuda del seguro (puede ser ms barato con su seguro), pero el sitio web puede darle el precio si no utiliz tourist information centre manager.  - Puede imprimir el cupn correspondiente y llevarlo con su receta a la farmacia.  - Tambin puede pasar por nuestra oficina durante el horario de atencin regular y education officer, museum una tarjeta de cupones de GoodRx.  - Si necesita que su receta se enve electrnicamente a una farmacia diferente, informe a nuestra oficina a travs de MyChart de [place] o por telfono llamando al [phone] y presione la opcin 4.

## 2024-08-05 ENCOUNTER — Encounter: Payer: Self-pay | Admitting: Dermatology

## 2024-09-23 ENCOUNTER — Ambulatory Visit: Admitting: Dermatology

## 2024-09-23 ENCOUNTER — Encounter: Payer: Self-pay | Admitting: Dermatology

## 2024-09-23 DIAGNOSIS — Z7189 Other specified counseling: Secondary | ICD-10-CM | POA: Diagnosis not present

## 2024-09-23 DIAGNOSIS — B079 Viral wart, unspecified: Secondary | ICD-10-CM

## 2024-09-23 DIAGNOSIS — Z79899 Other long term (current) drug therapy: Secondary | ICD-10-CM

## 2024-09-23 NOTE — Patient Instructions (Addendum)
 Prior to procedure, discussed risks of blister formation, small wound, skin dyspigmentation, or rare scar following cryotherapy. Recommend Vaseline ointment to treated areas while healing.    Due to recent changes in healthcare laws, you may see results of your pathology and/or laboratory studies on MyChart before the doctors have had a chance to review them. We understand that in some cases there may be results that are confusing or concerning to you. Please understand that not all results are received at the same time and often the doctors may need to interpret multiple results in order to provide you with the best plan of care or course of treatment. Therefore, we ask that you please give us  2 business days to thoroughly review all your results before contacting the office for clarification. Should we see a critical lab result, you will be contacted sooner.   If You Need Anything After Your Visit  If you have any questions or concerns for your doctor, please call our main line at [phone] and press option 4 to reach your doctor's medical assistant. If no one answers, please leave a voicemail as directed and we will return your call as soon as possible. Messages left after 4 pm will be answered the following business day.   You may also send us  a message via MyChart. We typically respond to MyChart messages within 1-2 business days.  For prescription refills, please ask your pharmacy to contact our office. Our fax number is [phone].  If you have an urgent issue when the clinic is closed that cannot wait until the next business day, you can page your doctor at the number below.    Please note that while we do our best to be available for urgent issues outside of office hours, we are not available 24/7.   If you have an urgent issue and are unable to reach us , you may choose to seek medical care at your doctor's office, retail clinic, urgent care center, or emergency room.  If you have  a medical emergency, please immediately call 911 or go to the emergency department.  Pager Numbers  - Dr. Hester: [phone]  - Dr. Jackquline: [phone]  - Dr. Claudene: [phone]   - Dr. Raymund: [phone]  In the event of inclement weather, please call our main line at [phone] for an update on the status of any delays or closures.  Dermatology Medication Tips: Please keep the boxes that topical medications come in in order to help keep track of the instructions about where and how to use these. Pharmacies typically print the medication instructions only on the boxes and not directly on the medication tubes.   If your medication is too expensive, please contact our office at [phone] option 4 or send us  a message through MyChart.   We are unable to tell what your co-pay for medications will be in advance as this is different depending on your insurance coverage. However, we may be able to find a substitute medication at lower cost or fill out paperwork to get insurance to cover a needed medication.   If a prior authorization is required to get your medication covered by your insurance company, please allow us  1-2 business days to complete this process.  Drug prices often vary depending on where the prescription is filled and some pharmacies may offer cheaper prices.  The website www.goodrx.com contains coupons for medications through different pharmacies. The prices here do not account for what the cost may be with help from insurance (  it may be cheaper with your insurance), but the website can give you the price if you did not use any insurance.  - You can print the associated coupon and take it with your prescription to the pharmacy.  - You may also stop by our office during regular business hours and pick up a GoodRx coupon card.  - If you need your prescription sent electronically to a different pharmacy, notify our office through [facility] or by phone at  [phone] option 4.     Si Usted Necesita Algo Despus de Su Visita  Tambin puede enviarnos un mensaje a travs de Clinical Cytogeneticist. Por lo general respondemos a los mensajes de MyChart en el transcurso de 1 a 2 das hbiles.  Para renovar recetas, por favor pida a su farmacia que se ponga en contacto con nuestra oficina. Randi lakes de fax es [place] [phone].  Si tiene un asunto urgente cuando la clnica est cerrada y que no puede esperar hasta el siguiente da hbil, puede llamar/localizar a su doctor(a) al nmero que aparece a continuacin.   Por favor, tenga en cuenta que aunque hacemos todo lo posible para estar disponibles para asuntos urgentes fuera del horario de [place], no estamos disponibles las 24 horas del da, los 7 [address] de la [place].   Si tiene un problema urgente y no puede comunicarse con nosotros, puede optar por buscar atencin mdica  en el consultorio de su doctor(a), en una clnica privada, en un centro de atencin urgente o en una sala de emergencias.  Si tiene engineer, drilling, por favor llame inmediatamente al 911 o vaya a la sala de emergencias.  Nmeros de bper  - Dr. Hester: [phone]  - Dra. Jackquline: [phone]  - Dr. Claudene: [phone]  - Dra. Kitts: [phone]  En caso de inclemencias del [place], por favor llame a nuestra lnea principal al [phone] para una actualizacin sobre el estado de cualquier retraso o cierre.  Consejos para la medicacin en dermatologa: Por favor, guarde las cajas en las que vienen los medicamentos de uso tpico para ayudarle a seguir las instrucciones sobre dnde y cmo usarlos. Las farmacias generalmente imprimen las instrucciones del medicamento slo en las cajas y no directamente en los tubos del [place].   Si su medicamento es muy caro, por favor, pngase en contacto con landry rieger llamando al [phone] y presione la opcin 4 o envenos un mensaje a travs de Clinical Cytogeneticist.   No podemos decirle  cul ser su copago por los medicamentos por adelantado ya que esto es diferente dependiendo de la cobertura de su seguro. Sin embargo, es posible que podamos encontrar un medicamento sustituto a audiological scientist un formulario para que el seguro cubra el medicamento que se considera necesario.   Si se requiere una autorizacin previa para que su compaa de seguros cubra su medicamento, por favor permtanos de 1 a 2 das hbiles para completar este proceso.  Los precios de los medicamentos varan con frecuencia dependiendo del environmental consultant de dnde se surte la receta y alguna farmacias pueden ofrecer precios ms baratos.  El sitio web www.goodrx.com tiene cupones para medicamentos de health and safety inspector. Los precios aqu no tienen en cuenta lo que podra costar con la ayuda del seguro (puede ser ms barato con su seguro), pero el sitio web puede darle el precio si no utiliz tourist information centre manager.  - Puede imprimir el cupn correspondiente y llevarlo con su receta a la farmacia.  - Tambin puede pasar por nuestra oficina  durante el horario de atencin regular y education officer, museum una tarjeta de cupones de GoodRx.  - Si necesita que su receta se enve electrnicamente a una farmacia diferente, informe a nuestra oficina a travs de MyChart de [place] o por telfono llamando al [phone] y presione la opcin 4.

## 2024-09-23 NOTE — Progress Notes (Signed)
 "  Follow-Up Visit   Subjective  [name] is a 64 y.o. female who presents for the following:  Here for 4 to 6 week wart follow up on right hand, was treated with Candida injection, Ln2 and has been using wart paste. Reports still has some areas and noticed a new spot on left thumb.  The following portions of the chart were reviewed this encounter and updated as appropriate: medications, allergies, medical history  Review of Systems:  No other skin or systemic complaints except as noted in HPI or Assessment and Plan.  Objective  Well appearing patient in no apparent distress; mood and affect are within normal limits.  A focused examination was performed of the following areas: B/l hands   Relevant exam findings are noted in the Assessment and Plan. left thumb pad x 1, Right ring finger x 1, right index finger at lateral nail fold and eponychium x 1, (3), right 4th finger DIP at joint x 1 Verrucous papules -- Discussed viral etiology and contagion.   Assessment & Plan  VIRAL WARTS, UNSPECIFIED TYPE (4) left thumb pad x 1, Right ring finger x 1, right index finger at lateral nail fold and eponychium x 1, (3), right 4th finger DIP at joint x 1 Viral Wart (HPV) Counseling  Discussed viral / HPV (Human Papilloma Virus) etiology and risk of spread /infectivity to other areas of body as well as to other people.  Multiple treatments and methods may be required to clear warts and it is possible treatment may not be successful.  Treatment risks include discoloration; scarring and there is still potential for wart recurrence.  Discussed Candida Injections Recommend Gardasil vaccine, prescription given to patient   Counseling on HPV and HPV vaccine HPV = human papilloma virus is a viral infection of the skin.  Viral warts/HPV may be common and transmitted on any part of the body by direct contact.  HPV can also be sexually transmitted and produced viral warts in the genital area that are  often called condyloma.  HPV/viral warts/condyloma can be difficult to treat and require multiple treatments and multiple treatment methods.  HPV infections may not be curable and have potential for persistence or recurrence despite multiple treatments.  There is a risk that especially certain types of HPV in the genital area can cause cervical cancer and other genital cancer.  Anecdotally, it seems to be helpful in some patients to have the HPV vaccine series of injections.  In some patients this seems to trigger the immune system to overcome especially difficult HPV infections that have not responded to multiple treatments and multiple methods of treatments. It may be recommended that patients with HPV infections consider HPV vaccine to control thier condition better.  HPV vaccines are FDA approved for up to age 24 years old and comes under the brand name of Gardasil vaccine.    Candida first injection to R 4th finger DIP at joint   Cont SM Wart paste ~5n/wk as tolerated - Destruction of lesion - left thumb pad x 1, Right ring finger x 1, right index finger at lateral nail fold and eponychium x 1, (3), right 4th finger DIP at joint x 1 Complexity: simple   Destruction method: cryotherapy   Informed consent: discussed and consent obtained   Timeout:  patient name, date of birth, surgical site, and procedure verified Lesion destroyed using liquid nitrogen: Yes   Region frozen until ice ball extended beyond lesion: Yes   Outcome: patient tolerated procedure  well with no complications   Post-procedure details: wound care instructions given    - Intralesional injection - right 4th finger DIP at joint x 1 Location: R 4th finger DIP at joint   Informed Consent: Discussed risks (infection, pain, bleeding, bruising, thinning of the skin, loss of skin pigment, lack of resolution, and recurrence of lesion) and benefits of the procedure, as well as the alternatives. Informed consent was  obtained. Preparation: The area was prepared a standard fashion.  Procedure Details: An intralesional injection was performed with candida antigen. 0.15 cc in total were injected.  Total number of injections: 1  Plan: The patient was instructed on post-op care. Recommend OTC analgesia as needed for pain.  NDC [address]-8 Lot [id number] Exp 07/28/2025 Candida albicans    Return for 6 - 8  week wart follow up.  IEleanor Blush, CMA, am acting as scribe for Alm Rhyme, MD.   Documentation: I have reviewed the above documentation for accuracy and completeness, and I agree with the above.  Alm Rhyme, MD    "

## 2024-11-18 ENCOUNTER — Ambulatory Visit: Admitting: Dermatology

## 2025-08-11 ENCOUNTER — Ambulatory Visit: Payer: BC Managed Care – PPO | Admitting: Dermatology
